# Patient Record
Sex: Female | Born: 1990 | Race: White | Hispanic: No | Marital: Single | Smoking: Former smoker
Health system: Southern US, Community
[De-identification: ages and names within clinical notes are randomized; demographics above are authoritative.]

## PROBLEM LIST (undated history)

## (undated) HISTORY — PX: INDUCED ABORTION: SHX677

---

## 2014-06-02 ENCOUNTER — Encounter (HOSPITAL_COMMUNITY): Payer: Self-pay | Admitting: Emergency Medicine

## 2014-06-02 ENCOUNTER — Emergency Department (HOSPITAL_COMMUNITY)
Admission: EM | Admit: 2014-06-02 | Discharge: 2014-06-02 | Disposition: A | Discharge status: Home / Self Care | Payer: Medicaid Other | Source: Home / Self Care | Attending: Emergency Medicine | Admitting: Emergency Medicine

## 2014-06-02 DIAGNOSIS — Z309 Encounter for contraceptive management, unspecified: Secondary | ICD-10-CM

## 2014-06-02 DIAGNOSIS — Z789 Other specified health status: Secondary | ICD-10-CM

## 2014-06-02 MED ORDER — LEVONORGESTREL 0.75 MG PO TABS: 0.7500 mg | ORAL_TABLET | Freq: Two times a day (BID) | ORAL | Status: DC

## 2014-06-02 NOTE — ED Provider Notes (Signed)
  Chief Complaint   Chief Complaint  Patient presents with  . Medication Refill    History of Present Illness   Taylor Vang is a 23 year old female who had unprotected intercourse yesterday morning. She comes in today to get the Plan B. She cannot buy this over-the-counter because of her Medicaid insurance which will not cover it unless she has a physician's prescription. She denies any GYN complaints. She has had no history of high blood pressure, phlebitis, blood clots, kidney or liver disease. Her menses have been regular. She is pregnant or breast-feeding.  Review of Systems   Other than as noted above, the patient denies any of the following symptoms: Systemic:  No fever or chills GI:  No abdominal pain, nausea, vomiting, diarrhea, constipation, melena or hematochezia. GU:  No dysuria, frequency, urgency, hematuria, vaginal discharge, itching, or abnormal vaginal bleeding.  PMFSH   Past medical history, family history, social history, meds, and allergies were reviewed.    Physical Examination    Vital signs:  There were no vitals taken for this visit. General:  Alert, oriented and in no distress. Lungs:  Breath sounds clear and equal bilaterally.  No wheezes, rales or rhonchi. Heart:  Regular rhythm.  No gallops or murmers. Abdomen:  Soft, flat and non-distended.  No organomegaly or mass.  No tenderness, guarding or rebound.  Bowel sounds normally active. Skin:  Clear, warm and dry.  Assessment   The encounter diagnosis was Emergency contraception.       Plan    1.  Meds:  The following meds were prescribed:   Discharge Medication List as of 06/02/2014  5:04 PM    START taking these medications   Details  levonorgestrel (PLAN B) 0.75 MG tablet Take 1 tablet (0.75 mg total) by mouth every 12 (twelve) hours., Starting 06/02/2014, Until Discontinued, Normal        2.  Patient Education/Counseling:  The patient was given appropriate handouts, self care  instructions, and instructed in symptomatic relief.    3.  Follow up:  The patient was told to follow up here if no better in 3 to 4 days, or sooner if becoming worse in any way, and given some red flag symptoms such as worsening pain, fever, persistent vomiting, or heavy vaginal bleeding which would prompt immediate return.       Reuben Likesavid C Aamilah Augenstein, MD 06/02/14 [phone]

## 2014-06-02 NOTE — Discharge Instructions (Signed)
Emergency Contraception  Emergency contraceptives prevent pregnancy after unprotected sexual intercourse. They can also be used:  · When a condom breaks.  · After a sexual assault.  · If you forgot to take your birth control pills.  · When inadequate protection occurs with sexual intercourse.  Usually, emergency contraception is a pill or combination of pills taken right after sex or up to 5 days after unprotected sex. It is most effective the sooner you take the pills after having sexual intercourse. Most types of emergency contraceptive pills are available without a prescription. One type requires a prescription from your health care provider. Also, young women under age 17 need a prescription for most types of emergency contraception. Check with your pharmacist. Do not use emergency contraception as your only form of birth control. These pills do not protect against sexually transmitted infections (STIs).   Emergency contraception will not work if you are already pregnant and will not harm the baby if you are pregnant. Emergency contraception does not cause an abortion. The pills work by preventing the ovaries from releasing an egg (ovulation) or the fertilization of an egg. Taking St. John's wort, certain antibiotic medicines, and certain anticonvulsant medicines may make these pills less effective.  Discuss with your health care provider the possible side effects of emergency contraceptives. These may include:  · Abdominal pain and cramps.  · Breast tenderness.  · Headache.  · Dizziness.  · Fatigue.  · Irregular bleeding or spotting.  TYPES OF EMERGENCY CONTRACEPTIVES  · Some types of emergency contraceptive pills contain estrogen and progesterone in higher doses.  · Some types just contain progesterone. They are available as a single pill or two pills taken 12-24 hours apart.  · One type of pill is not a hormone. It prevents the hormone progesterone from having its normal effect on ovulation and the lining of  the uterus.  · An intrauterine device (IUD) may be used. This T-shaped device is also used as a form of birth control. It is inserted into the uterus to prevent pregnancy. The copper IUD can also be used as emergency contraception if inserted within 5 days of having unprotected intercourse.  HOME CARE INSTRUCTIONS   · Eat something before taking the emergency contraceptive pills.  · Lie down for a couple of hours if you become tired or dizzy.  · Continue using birth control until you start your menstrual period.  SEEK MEDICAL CARE IF:   · You throw up (vomit) within 2 hours after taking the pill. You will have to take another pill.  · You need treatment for nausea, vomiting, headache, or abdominal cramps.  · You have not had a menstrual period 21 days after taking the pill.  · You are having irregular bleeding or spotting.  SEEK IMMEDIATE MEDICAL CARE IF:   · You have chest pain.  · You have leg pain.  · You have numbness or weakness of your arms or legs.  · You have slurred speech.  · You have visual problems.  Document Released: 12/09/2001 Document Revised: 02/14/2014 Document Reviewed: 03/14/2013  ExitCare® Patient Information ©2015 [employer]. This information is not intended to replace advice given to you by your health care provider. Make sure you discuss any questions you have with your health care provider.

## 2014-06-02 NOTE — ED Notes (Signed)
Pt requesting morning after pill.

## 2014-11-05 ENCOUNTER — Inpatient Hospital Stay (HOSPITAL_COMMUNITY)
Admission: EM | Admit: 2014-11-05 | Discharge: 2014-11-06 | Disposition: A | Discharge status: Home / Self Care | Payer: Medicaid Other | Attending: Obstetrics and Gynecology | Admitting: Obstetrics and Gynecology

## 2014-11-05 DIAGNOSIS — O4690 Antepartum hemorrhage, unspecified, unspecified trimester: Secondary | ICD-10-CM

## 2014-11-05 DIAGNOSIS — O034 Incomplete spontaneous abortion without complication: Secondary | ICD-10-CM | POA: Diagnosis present

## 2014-11-05 DIAGNOSIS — Z87891 Personal history of nicotine dependence: Secondary | ICD-10-CM | POA: Insufficient documentation

## 2014-11-05 DIAGNOSIS — R55 Syncope and collapse: Secondary | ICD-10-CM | POA: Diagnosis not present

## 2014-11-05 LAB — BASIC METABOLIC PANEL
Anion gap: 8 (ref 5–15)
BUN: 10 mg/dL (ref 6–23)
CHLORIDE: 106 mmol/L (ref 96–112)
CO2: 21 mmol/L (ref 19–32)
Calcium: 9.3 mg/dL (ref 8.4–10.5)
Creatinine, Ser: 0.49 mg/dL — ABNORMAL LOW (ref 0.50–1.10)
GFR calc non Af Amer: >90 mL/min (ref >90)
Glucose, Bld: 105 mg/dL — ABNORMAL HIGH (ref 70–99)
Potassium: 3.5 mmol/L (ref 3.5–5.1)
Sodium: 135 mmol/L (ref 135–145)

## 2014-11-05 LAB — CBC WITH DIFFERENTIAL/PLATELET
Basophils Absolute: 0 10*3/uL (ref 0.0–0.1)
Basophils Relative: 0 % (ref 0–1)
Eosinophils Absolute: 0.2 10*3/uL (ref 0.0–0.7)
Eosinophils Relative: 2 % (ref 0–5)
HEMATOCRIT: 34.5 % — AB (ref 36.0–46.0)
HEMOGLOBIN: 11.7 g/dL — AB (ref 12.0–15.0)
LYMPHS PCT: 22 % (ref 12–46)
Lymphs Abs: 2.2 10*3/uL (ref 0.7–4.0)
MCH: 30.2 pg (ref 26.0–34.0)
MCHC: 33.9 g/dL (ref 30.0–36.0)
MCV: 89.1 fL (ref 78.0–100.0)
MONO ABS: 0.7 10*3/uL (ref 0.1–1.0)
Monocytes Relative: 7 % (ref 3–12)
Neutro Abs: 6.8 10*3/uL (ref 1.7–7.7)
Neutrophils Relative %: 69 % (ref 43–77)
PLATELETS: 166 10*3/uL (ref 150–400)
RBC: 3.87 MIL/uL (ref 3.87–5.11)
RDW: 12.4 % (ref 11.5–15.5)
WBC: 9.9 10*3/uL (ref 4.0–10.5)

## 2014-11-05 LAB — I-STAT CHEM 8, ED
BUN: 8 mg/dL (ref 6–23)
CHLORIDE: 105 mmol/L (ref 96–112)
CREATININE: 0.5 mg/dL (ref 0.50–1.10)
Calcium, Ion: 1.19 mmol/L (ref 1.12–1.23)
Glucose, Bld: 109 mg/dL — ABNORMAL HIGH (ref 70–99)
HCT: 37 % (ref 36.0–46.0)
HEMOGLOBIN: 12.6 g/dL (ref 12.0–15.0)
Potassium: 3.5 mmol/L (ref 3.5–5.1)
SODIUM: 139 mmol/L (ref 135–145)
TCO2: 18 mmol/L (ref 0–100)

## 2014-11-05 MED ORDER — SODIUM CHLORIDE 0.9 % IV BOLUS (SEPSIS)
1000.0000 mL | Freq: Once | INTRAVENOUS | Status: AC
  Administered 2014-11-05: 1000 mL via INTRAVENOUS

## 2014-11-05 MED ORDER — SODIUM CHLORIDE 0.9 % IJ SOLN
3.0000 mL | Freq: Two times a day (BID) | INTRAMUSCULAR | Status: DC
  Administered 2014-11-05: 3 mL via INTRAVENOUS

## 2014-11-05 MED ORDER — SODIUM CHLORIDE 0.9 % IV SOLN
250.0000 mL | INTRAVENOUS | Status: DC | PRN
  Administered 2014-11-06: 250 mL via INTRAVENOUS

## 2014-11-05 MED ORDER — SODIUM CHLORIDE 0.9 % IJ SOLN: 3.0000 mL | INTRAMUSCULAR | Status: DC | PRN

## 2014-11-05 NOTE — ED Notes (Signed)
MD and PA notified patient is having gross amount of bright red vaginal bleeding and is starting to feel dizziness. Still ambulatory with steady gait.

## 2014-11-05 NOTE — ED Provider Notes (Addendum)
CSN: [id number]     Arrival date & time 11/05/14  2206 History   First MD Initiated Contact with Patient 11/05/14 2224     Chief Complaint  Patient presents with  . Vaginal Bleeding     (Consider location/radiation/quality/duration/timing/severity/associated sxs/prior Treatment) HPI 24 year old female, G1, P0, presents to emergency department with complaint of heavy vaginal bleeding.  She reports she has had spotting for the last 3 days, tonight had "profuse vaginal bleeding" with abdominal cramping.  Her last normal menstrual period was in November.  She reports only a few days of spotting in December.  Patient is not currently taking birth control.  She reports she has potential to be pregnant.  She denies any nausea, breast tenderness or other early pregnancy signs.  Patient has a 25-year-old daughter, delivered by C-section secondary to failure to progress after induction.  She denies any other surgeries, medical problems or medications.  Patient reports that she is slightly dizzy, but feeling better now that she is laying down.  Patient was tachycardic upon arrival. No past medical history on file. No past surgical history on file. No family history on file. History  Substance Use Topics  . Smoking status: Never Smoker   . Smokeless tobacco: Not on file  . Alcohol Use: Yes   OB History    No data available     Review of Systems  See History of Present Illness; otherwise all other systems are reviewed and negative   Allergies  Sulfa antibiotics  Home Medications   Prior to Admission medications   Medication Sig Start Date End Date Taking? Authorizing Provider  levonorgestrel (PLAN B) 0.75 MG tablet Take 1 tablet (0.75 mg total) by mouth every 12 (twelve) hours. 06/02/14   Reuben Likesavid C Keller, MD   BP 115/67 mmHg  Pulse 75  Temp(Src) 98.4 F (36.9 C) (Oral)  Resp 16  SpO2 100%  LMP 10/05/2014 (Approximate) Physical Exam  Constitutional: She is oriented to person, place, and  time. She appears well-developed and well-nourished.  HENT:  Head: Normocephalic and atraumatic.  Nose: Nose normal.  Mouth/Throat: Oropharynx is clear and moist.  Eyes: Conjunctivae and EOM are normal. Pupils are equal, round, and reactive to light.  Neck: Normal range of motion. Neck supple. No JVD present. No tracheal deviation present. No thyromegaly present.  Cardiovascular: Normal rate, regular rhythm, normal heart sounds and intact distal pulses.  Exam reveals no gallop and no friction rub.   No murmur heard. Pulmonary/Chest: Effort normal and breath sounds normal. No stridor. No respiratory distress. She has no wheezes. She has no rales. She exhibits no tenderness.  Abdominal: Soft. Bowel sounds are normal. She exhibits no distension and no mass. There is no tenderness. There is no rebound and no guarding.  Genitourinary:  External genitalia within normal limits Vagina with large amount of blood discharge Cervix  os is slightly open, large blood clot noted.  No lacerations or other source of bleeding noted negative for cervical motion tenderness Adnexa palpated, no masses or negative for tenderness noted Bladder palpated negative for tenderness Uterus palpated no masses and negative for tenderness    Musculoskeletal: Normal range of motion. She exhibits no edema or tenderness.  Lymphadenopathy:    She has no cervical adenopathy.  Neurological: She is alert and oriented to person, place, and time. She displays normal reflexes. She exhibits normal muscle tone. Coordination normal.  Skin: Skin is warm and dry. No rash noted. No erythema. No pallor.  Psychiatric: She has a  normal mood and affect. Her behavior is normal. Judgment and thought content normal.  Nursing note and vitals reviewed.   ED Course  Procedures (including critical care time) Labs Review Labs Reviewed  HCG, QUANTITATIVE, PREGNANCY - Abnormal; Notable for the following:    hCG, Beta Chain, [place] [address] (*)     All other components within normal limits  BASIC METABOLIC PANEL - Abnormal; Notable for the following:    Glucose, Bld 105 (*)    Creatinine, Ser 0.49 (*)    All other components within normal limits  CBC WITH DIFFERENTIAL/PLATELET - Abnormal; Notable for the following:    Hemoglobin 11.7 (*)    HCT 34.5 (*)    All other components within normal limits  CBC - Abnormal; Notable for the following:    WBC 14.8 (*)    RBC 3.09 (*)    Hemoglobin 9.4 (*)    HCT 27.7 (*)    All other components within normal limits  I-STAT CHEM 8, ED - Abnormal; Notable for the following:    Glucose, Bld 109 (*)    All other components within normal limits  TYPE AND SCREEN  ABO/RH    Imaging Review No results found.   EKG Interpretation None     CRITICAL CARE Performed by: Olivia Mackie Total critical care time: 90 min Critical care time was exclusive of separately billable procedures and treating other patients. Critical care was necessary to treat or prevent imminent or life-threatening deterioration. Critical care was time spent personally by me on the following activities: development of treatment plan with patient and/or surrogate as well as nursing, discussions with consultants, evaluation of patient's response to treatment, examination of patient, obtaining history from patient or surrogate, ordering and performing treatments and interventions, ordering and review of laboratory studies, ordering and review of radiographic studies, pulse oximetry and re-evaluation of patient's condition.  MDM   Final diagnoses:  Vaginal bleeding in pregnancy  Incomplete miscarriage    24 year old female with significant vaginal bleeding requiring suctioning and multiple qtips to clear vagina.  She has a clot in the os and bleeding around the clot.  Patient is nontender.  Differential at this time includes miscarriage, ectopic pregnancy, menorrhagia.  Plan for labs, fluid bolus, pregnancy test and  ultrasound.  12:47 AM HCG is positive, ultrasound ordered.  Patient hemodynamically stable, still having bleeding.  Patient updated on findings and plan.  2:19 AM No signs of ectopic pregnancy, IUP with slight sac, abortion in progress.  Patient updated on findings.  She reports that she is still passing clots and having gushes of blood, but it has slowed down significantly.  She tends to have periods of bleeding and then it will cease for some time.  She is having some lower abdominal cramping.  She reports that she has been followed with [facility] in the past, but is unsure if she is currently a patient with them.  [facility], if she is bleeding more than a pad an hour continuously, weakness, dizziness, worsening pain or other new concerning symptoms.  Olivia Mackie, MD 11/06/14 0224  3:40 AM Pt was to be d/c, but after iv was removed began to have n/v.  ODT zofran given, pt still vomiting, weak dizzy, low BP.  Plan for recheck of cbc, replace iv and give iv zofran, fluid bolus.  4:15 AM Case discussed with Dr. Emelda Fear, on-call for OB/GYN.  He requests Pitocin infusion started.  Patient improved after almost a liter of fluid, blood pressure has come up to 90s over 50s.  She is no longer feeling dizzy or lightheaded while laying flat.  Hemoglobin rechecked, 2 point drop.  Patient reports bleeding has stopped at this time.  Patient to be transferred over to [facility], [facility] for further evaluation and stabilization.  Olivia Mackie, MD 11/06/14 [phone]

## 2014-11-05 NOTE — ED Notes (Addendum)
Pt reports "perfuse vaginal bleeding" with "big red clots" starting recently with abdominal cramping. Afraid "I am losing too much blood." Last menstrual cycle was regular and a month ago. Patient is unsure if she could be pregnant. Patient is tearful and appears anxious. No other complaints/concerns.

## 2014-11-06 ENCOUNTER — Encounter (HOSPITAL_COMMUNITY): Payer: Self-pay | Admitting: *Deleted

## 2014-11-06 ENCOUNTER — Emergency Department (HOSPITAL_COMMUNITY): Payer: Medicaid Other

## 2014-11-06 DIAGNOSIS — O034 Incomplete spontaneous abortion without complication: Secondary | ICD-10-CM | POA: Diagnosis present

## 2014-11-06 LAB — CBC
HCT: 27.7 % — ABNORMAL LOW (ref 36.0–46.0)
Hemoglobin: 9.4 g/dL — ABNORMAL LOW (ref 12.0–15.0)
MCH: 30.4 pg (ref 26.0–34.0)
MCHC: 33.9 g/dL (ref 30.0–36.0)
MCV: 89.6 fL (ref 78.0–100.0)
Platelets: 193 10*3/uL (ref 150–400)
RBC: 3.09 MIL/uL — ABNORMAL LOW (ref 3.87–5.11)
RDW: 12.5 % (ref 11.5–15.5)
WBC: 14.8 10*3/uL — ABNORMAL HIGH (ref 4.0–10.5)

## 2014-11-06 LAB — TYPE AND SCREEN
ABO/RH(D): B POS
ANTIBODY SCREEN: NEGATIVE

## 2014-11-06 LAB — HCG, QUANTITATIVE, PREGNANCY: hCG, Beta Chain, Quant, S: 20196 m[IU]/mL — ABNORMAL HIGH (ref <5)

## 2014-11-06 LAB — ABO/RH: ABO/RH(D): B POS

## 2014-11-06 MED ORDER — OXYTOCIN 40 UNITS IN LACTATED RINGERS INFUSION - SIMPLE MED
250.0000 mL/h | INTRAVENOUS | Status: DC
  Administered 2014-11-06: 250 mL/h via INTRAVENOUS
  Filled 2014-11-06: qty 1000

## 2014-11-06 MED ORDER — ONDANSETRON 8 MG PO TBDP
8.0000 mg | ORAL_TABLET | Freq: Once | ORAL | Status: AC
  Administered 2014-11-06: 8 mg via ORAL
  Filled 2014-11-06: qty 2

## 2014-11-06 MED ORDER — NORETHINDRONE 0.35 MG PO TABS: 1.0000 | ORAL_TABLET | Freq: Every day | ORAL | Status: AC

## 2014-11-06 MED ORDER — ONDANSETRON 8 MG PO TBDP: 8.0000 mg | ORAL_TABLET | Freq: Three times a day (TID) | ORAL | Status: AC | PRN

## 2014-11-06 MED ORDER — IBUPROFEN 800 MG PO TABS
800.0000 mg | ORAL_TABLET | Freq: Once | ORAL | Status: AC
  Administered 2014-11-06: 800 mg via ORAL
  Filled 2014-11-06: qty 1

## 2014-11-06 MED ORDER — SODIUM CHLORIDE 0.9 % IV BOLUS (SEPSIS)
1000.0000 mL | Freq: Once | INTRAVENOUS | Status: AC
  Administered 2014-11-06: 1000 mL via INTRAVENOUS

## 2014-11-06 MED ORDER — IBUPROFEN 800 MG PO TABS: 800.0000 mg | ORAL_TABLET | Freq: Three times a day (TID) | ORAL | Status: AC | PRN

## 2014-11-06 MED ORDER — FENTANYL CITRATE 0.05 MG/ML IJ SOLN
50.0000 ug | Freq: Once | INTRAMUSCULAR | Status: AC
Start: 2014-11-06 — End: 2014-11-06
  Administered 2014-11-06: 50 ug via INTRAVENOUS
  Filled 2014-11-06: qty 2

## 2014-11-06 MED ORDER — ONDANSETRON HCL 4 MG/2ML IJ SOLN
4.0000 mg | Freq: Once | INTRAMUSCULAR | Status: AC
  Administered 2014-11-06: 4 mg via INTRAVENOUS
  Filled 2014-11-06: qty 2

## 2014-11-06 MED ORDER — MORPHINE SULFATE 4 MG/ML IJ SOLN
4.0000 mg | Freq: Once | INTRAMUSCULAR | Status: AC
  Administered 2014-11-06: 4 mg via INTRAVENOUS
  Filled 2014-11-06: qty 1

## 2014-11-06 MED ORDER — MISOPROSTOL 200 MCG PO TABS: 200.0000 ug | ORAL_TABLET | Freq: Once | ORAL | Status: AC

## 2014-11-06 MED ORDER — HYDROCODONE-ACETAMINOPHEN 5-325 MG PO TABS
2.0000 | ORAL_TABLET | ORAL | Status: AC | PRN
Start: 2014-11-06 — End: ?

## 2014-11-06 MED ORDER — IBUPROFEN 800 MG PO TABS
800.0000 mg | ORAL_TABLET | Freq: Once | ORAL | Status: DC
  Filled 2014-11-06: qty 1

## 2014-11-06 NOTE — Progress Notes (Signed)
Dr Emelda FearFerguson notified of pt's admission and status. Will see pt

## 2014-11-06 NOTE — Progress Notes (Signed)
Written and verbal d/c instructions given and understanding voiced. 

## 2014-11-06 NOTE — MAU Provider Note (Signed)
History     CSN: [id number]  Arrival date and time: 11/05/14 2206   None     Chief Complaint  Patient presents with  . Vaginal Bleeding    pregnancy diagnosed tonight   HPI  Transferred from [facility] where pt was evaluated , miscarrage diagnosed and heavy bleeding occurred, and pt became syncopal due to bleeding, received iv fluids and pitocin, transferred here for completion of eval.   Pertinent Gynecological History: Menses: late on menses, no known dx of pregnancy til tonight Bleeding: intermenstrual bleeding Contraception: none DES exposure: unknown Blood transfusions: none Sexually transmitted diseases: no past history and new partner x 1 yr. Previous GYN Procedures:   Last mammogram:  Date:  Last pap:  Date:  P t has noted a tiny 1 mm pigmented area at clitoris, that is smooth edged not hard, nontender. It is either a skin nevus or early condyloma. Differential and monitoring recommendations discussed at length. Excision to be considered for skin changes , growth.  History reviewed. No pertinent past medical history.  Past Surgical History  Procedure Laterality Date  . Cesarean section    . Induced abortion      Family History  Problem Relation Age of Onset  . Alcohol abuse Neg Hx     History  Substance Use Topics  . Smoking status: Former Smoker    Types: Cigarettes  . Smokeless tobacco: Not on file  . Alcohol Use: 1.2 oz/week    2 Cans of beer per week    Allergies:  Allergies  Allergen Reactions  . Sulfa Antibiotics Shortness Of Breath and Rash    Prescriptions prior to admission  Medication Sig Dispense Refill Last Dose  . Multiple Vitamin (MULTIVITAMIN WITH MINERALS) TABS tablet Take 1-3 tablets by mouth daily.   11/05/2014 at Unknown time  . levonorgestrel (PLAN B) 0.75 MG tablet Take 1 tablet (0.75 mg total) by mouth every 12 (twelve) hours. (Patient not taking: Reported on 11/05/2014) 2 tablet 5     ROS Physical Exam   Blood pressure 98/55,  pulse 104, temperature 98 F (36.7 C), temperature source Oral, resp. rate 18, last menstrual period 10/05/2014, SpO2 100 %, unknown if currently breastfeeding.  Physical Exam  Constitutional: She is oriented to person, place, and time. She appears well-developed and well-nourished.  HENT:  Head: Normocephalic and atraumatic.  Eyes: Pupils are equal, round, and reactive to light.  Pale, Eurasian lite skin  Neck: Normal range of motion.  Cardiovascular: Normal rate and regular rhythm.   Respiratory: Effort normal.  GI: Soft.  Genitourinary: Vagina normal.  Uterus shows an open os 1.5 cm, allowing passage of Ring forceps, Tissue and clot visible in os. Able to extract several fragments of tissue by serial extraction by ring forceps, Ultrasound at bedside shows a small thin remaining endometrial tissue less than 1 cm thick, pt expected to easily expel any residual tissue fragments , and further uterine exploration declined.  Musculoskeletal: Normal range of motion.  Neurological: She is alert and oriented to person, place, and time.  Skin: Skin is warm and dry.  Psychiatric: She has a normal mood and affect. Her behavior is normal. Thought content normal.   CBC    Component Value Date/Time   WBC 14.8* 11/06/2014 0355   RBC 3.09* 11/06/2014 0355   HGB 9.4* 11/06/2014 0355   HCT 27.7* 11/06/2014 0355   PLT 193 11/06/2014 0355   MCV 89.6 11/06/2014 0355   MCH 30.4 11/06/2014 0355   MCHC  33.9 11/06/2014 0355   RDW 12.5 11/06/2014 0355   LYMPHSABS 2.2 11/05/2014 2312   MONOABS 0.7 11/05/2014 2312   EOSABS 0.2 11/05/2014 2312   BASOSABS 0.0 11/05/2014 2312     MAU Course  Procedures extraction of tissue from os, and PO cytotec administered. Blood type confirmed as B POS. MDM   Assessment and Plan  Incomplete ab, completed. Rh POSitive  Plan PO cytotec        D/C home      followup at gyn 2 wk     Rx micronor   Jenya Putz V 11/06/2014, 6:36 AM

## 2014-11-06 NOTE — MAU Note (Signed)
[facility]. Pt alert and oriented. Pale color. On arrival voided 400cc on bedpan and passed 5cm clot.

## 2014-11-06 NOTE — Discharge Instructions (Signed)
Pelvic Rest Pelvic rest is sometimes recommended for women when:   The placenta is partially or completely covering the opening of the cervix (placenta previa).  There is bleeding between the uterine wall and the amniotic sac in the first trimester (subchorionic hemorrhage).  The cervix begins to open without labor starting (incompetent cervix, cervical insufficiency).  The labor is too early (preterm labor). HOME CARE INSTRUCTIONS  Do not have sexual intercourse, stimulation, or an orgasm.  Do not use tampons, douche, or put anything in the vagina.  Do not lift anything over 10 pounds (4.5 kg).  Avoid strenuous activity or straining your pelvic muscles. SEEK MEDICAL CARE IF:  You have any vaginal bleeding during pregnancy. Treat this as a potential emergency.  You have cramping pain felt low in the stomach (stronger than menstrual cramps).  You notice vaginal discharge (watery, mucus, or bloody).  You have a low, dull backache.  There are regular contractions or uterine tightening. SEEK IMMEDIATE MEDICAL CARE IF: You have vaginal bleeding and have placenta previa.  Document Released: 01/25/2011 Document Revised: 12/23/2011 Document Reviewed: 01/25/2011 [facility] Patient Information 2015 [place] [place]. This information is not intended to replace advice given to you by your health care provider. Make sure you discuss any questions you have with your health care provider.  Miscarriage A miscarriage is the sudden loss of an unborn baby (fetus) before the 20th week of pregnancy. Most miscarriages happen in the first 3 months of pregnancy. Sometimes, it happens before a woman even knows she is pregnant. A miscarriage is also called a "spontaneous miscarriage" or "early pregnancy loss." Having a miscarriage can be an emotional experience. Talk with your caregiver about any questions you may have about miscarrying, the grieving process, and your future pregnancy plans. CAUSES    Problems with the fetal chromosomes that make it impossible for the baby to develop normally. Problems with the baby's genes or chromosomes are most often the result of errors that occur, by chance, as the embryo divides and grows. The problems are not inherited from the parents.  Infection of the cervix or uterus.   Hormone problems.   Problems with the cervix, such as having an incompetent cervix. This is when the tissue in the cervix is not strong enough to hold the pregnancy.   Problems with the uterus, such as an abnormally shaped uterus, uterine fibroids, or congenital abnormalities.   Certain medical conditions.   Smoking, drinking alcohol, or taking illegal drugs.   Trauma.  Often, the cause of a miscarriage is unknown.  SYMPTOMS   Vaginal bleeding or spotting, with or without cramps or pain.  Pain or cramping in the abdomen or lower back.  Passing fluid, tissue, or blood clots from the vagina. DIAGNOSIS  Your caregiver will perform a physical exam. You may also have an ultrasound to confirm the miscarriage. Blood or urine tests may also be ordered. TREATMENT   Sometimes, treatment is not necessary if you naturally pass all the fetal tissue that was in the uterus. If some of the fetus or placenta remains in the body (incomplete miscarriage), tissue left behind may become infected and must be removed. Usually, a dilation and curettage (D and C) procedure is performed. During a D and C procedure, the cervix is widened (dilated) and any remaining fetal or placental tissue is gently removed from the uterus.  Antibiotic medicines are prescribed if there is an infection. Other medicines may be given to reduce the size of the uterus (contract) if  there is a lot of bleeding.  If you have Rh negative blood and your baby was Rh positive, you will need a Rh immunoglobulin shot. This shot will protect any future baby from having Rh blood problems in future pregnancies. HOME  CARE INSTRUCTIONS   Your caregiver may order bed rest or may allow you to continue light activity. Resume activity as directed by your caregiver.  Have someone help with home and family responsibilities during this time.   Keep track of the number of sanitary pads you use each day and how soaked (saturated) they are. Write down this information.   Do not use tampons. Do not douche or have sexual intercourse until approved by your caregiver.   Only take over-the-counter or prescription medicines for pain or discomfort as directed by your caregiver.   Do not take aspirin. Aspirin can cause bleeding.   Keep all follow-up appointments with your caregiver.   If you or your partner have problems with grieving, talk to your caregiver or seek counseling to help cope with the pregnancy loss. Allow enough time to grieve before trying to get pregnant again.  SEEK IMMEDIATE MEDICAL CARE IF:   You have severe cramps or pain in your back or abdomen.  You have a fever.  You pass large blood clots (walnut-sized or larger) ortissue from your vagina. Save any tissue for your caregiver to inspect.   Your bleeding increases.   You have a thick, bad-smelling vaginal discharge.  You become lightheaded, weak, or you faint.   You have chills.  MAKE SURE YOU:  Understand these instructions.  Will watch your condition.  Will get help right away if you are not doing well or get worse. Document Released: 03/26/2001 Document Revised: 01/25/2013 Document Reviewed: 11/19/2011 [facility] Patient Information 2015 [place], [place]. This information is not intended to replace advice given to you by your health care provider. Make sure you discuss any questions you have with your health care provider.

## 2014-11-06 NOTE — MAU Note (Signed)
Pt stood at bedside and tolerated well. No dizziness. Drinking flds well.

## 2014-11-06 NOTE — Progress Notes (Signed)
Dr Emelda FearFerguson removed some tissue from cervix and sent to pathology

## 2014-11-06 NOTE — ED Notes (Signed)
Pt discharge was rescinded d/t patient weakness, N/V episodes, and gross amount of hemorrhage. Pt states she continued to feel weak every time she stands to put on her clothes to go home. She is accompanied by her significant other at the bedside. MD aware that pt is feeling weak and hypotensive. Syncope episode while patient in sitting position was witnessed by Clinical research associatewriter, Alvester ChouJeneen,RN and  Physician and pt was assisted to supine position.

## 2014-11-06 NOTE — Progress Notes (Addendum)
Dr Emelda FearFerguson did bedside u/s and only small tissue fragments seen in  Uterus. OK to d/c IVFs per Dr Emelda FearFerguson

## 2014-12-10 ENCOUNTER — Emergency Department (HOSPITAL_COMMUNITY)
Admission: EM | Admit: 2014-12-10 | Discharge: 2014-12-10 | Disposition: A | Discharge status: Home / Self Care | Payer: Medicaid Other | Source: Home / Self Care | Attending: Emergency Medicine | Admitting: Emergency Medicine

## 2014-12-10 ENCOUNTER — Encounter (HOSPITAL_COMMUNITY): Payer: Self-pay | Admitting: Emergency Medicine

## 2014-12-10 DIAGNOSIS — B001 Herpesviral vesicular dermatitis: Secondary | ICD-10-CM

## 2014-12-10 NOTE — ED Notes (Addendum)
Cold sores, "nervous system failure" per patient Called out of work today, employer demanding a note

## 2014-12-10 NOTE — Discharge Instructions (Signed)
Cold Sore  A cold sore (fever blister) is a skin infection caused by the herpes simplex virus (HSV-1). HSV-1 is closely related to the virus that causes genital herpes (HSV-2), but they are not the same even though both viruses can cause oral and genital infections. Cold sores are small, fluid-filled sores inside of the mouth or on the lips, gums, nose, chin, cheeks, or fingers.   The herpes simplex virus can be easily passed (contagious) to other people through close personal contact, such as kissing or sharing personal items. The virus can also spread to other parts of the body, such as the eyes or genitals. Cold sores are contagious until the sores crust over completely. They often heal within 2 weeks.   Once a person is infected, the herpes simplex virus remains permanently in the body. Therefore, there is no cure for cold sores, and they often recur when a person is tired, stressed, sick, or gets too much sun. Additional factors that can cause a recurrence include hormone changes in menstruation or pregnancy, certain drugs, and cold weather.   CAUSES   Cold sores are caused by the herpes simplex virus. The virus is spread from person to person through close contact, such as through kissing, touching the affected area, or sharing personal items such as lip balm, razors, or eating utensils.   SYMPTOMS   The first infection may not cause symptoms. If symptoms develop, the symptoms often go through different stages. Here is how a cold sore develops:   · Tingling, itching, or burning is felt 1-2 days before the outbreak.    · Fluid-filled blisters appear on the lips, inside the mouth, nose, or on the cheeks.    · The blisters start to ooze clear fluid.    · The blisters dry up and a yellow crust appears in its place.    · The crust falls off.    Symptoms depend on whether it is the initial outbreak or a recurrence. Some other symptoms with the first outbreak may include:   · Fever.    · Sore throat.    · Headache.     · Muscle aches.    · Swollen neck glands.    DIAGNOSIS   A diagnosis is often made based on your symptoms and looking at the sores. Sometimes, a sore may be swabbed and then examined in the lab to make a final diagnosis. If the sores are not present, blood tests can find the herpes simplex virus.   TREATMENT   There is no cure for cold sores and no vaccine for the herpes simplex virus. Within 2 weeks, most cold sores go away on their own without treatment. Medicines cannot make the infection go away, but medicine can help relieve some of the pain associated with the sores, can work to stop the virus from multiplying, and can also shorten healing time. Medicine may be in the form of creams, gels, pills, or a shot.   HOME CARE INSTRUCTIONS   · Only take over-the-counter or prescription medicines for pain, discomfort, or fever as directed by your caregiver. Do not use aspirin.    · Use a cotton-tip swab to apply creams or gels to your sores.    · Do not touch the sores or pick the scabs. Wash your hands often. Do not touch your eyes without washing your hands first.    · Avoid kissing, oral sex, and sharing personal items until sores heal.    · Apply an ice pack on your sores for 10-15 minutes to ease any discomfort.    ·   Avoid hot, cold, or salty foods because they may hurt your mouth. Eat a soft, bland diet to avoid irritating the sores. Use a straw to drink if you have pain when drinking out of a glass.    · Keep sores clean and dry to prevent an infection of other tissues.    · Avoid the sun and limit stress if these things trigger outbreaks. If sun causes cold sores, apply sunscreen on the lips before being out in the sun.    SEEK MEDICAL CARE IF:   · You have a fever or persistent symptoms for more than 2-3 days.    · You have a fever and your symptoms suddenly get worse.    · You have pus, not clear fluid, coming from the sores.    · You have redness that is spreading.    · You have pain or irritation in your  eye.    · You get sores on your genitals.    · Your sores do not heal within 2 weeks.    · You have a weakened immune system.    · You have frequent recurrences of cold sores.    MAKE SURE YOU:   · Understand these instructions.  · Will watch your condition.  · Will get help right away if you are not doing well or get worse.  Document Released: 09/27/2000 Document Revised: 02/14/2014 Document Reviewed: 02/12/2012  ExitCare® Patient Information ©2015 [employer]. This information is not intended to replace advice given to you by your health care provider. Make sure you discuss any questions you have with your health care provider.

## 2014-12-10 NOTE — ED Provider Notes (Signed)
CSN: [id number]     Arrival date & time 12/10/14  1640 History   First MD Initiated Contact with Patient 12/10/14 1825     Chief Complaint  Patient presents with  . Mouth Lesions   (Consider location/radiation/quality/duration/timing/severity/associated sxs/prior Treatment) HPI Comments: Patient states she presents here for work note excusing her from work today. She states she needed a "mental health day" but when she called he employer to tell him/her she was not coming to work, she was informed that she would need a doctor's note before she would be allowed to return to work. Also wishes to mention that she developed a cold sore at her left upper lip 3 days ago, but does not wish to be treated for this.  PCP; none  Patient is a 24 y.o. female presenting with mouth sores. The history is provided by the patient.  Mouth Lesions   History reviewed. No pertinent past medical history. Past Surgical History  Procedure Laterality Date  . Cesarean section    . Induced abortion     Family History  Problem Relation Age of Onset  . Alcohol abuse Neg Hx    History  Substance Use Topics  . Smoking status: Former Smoker    Types: Cigarettes  . Smokeless tobacco: Not on file  . Alcohol Use: 1.2 oz/week    2 Cans of beer per week   OB History    Gravida Para Term Preterm AB TAB SAB Ectopic Multiple Living   Review of Systems  Constitutional: Negative.   HENT: Positive for mouth sores.   Eyes: Negative.   Respiratory: Negative.   Skin: Negative.        Cold sore at left upper lip  Psychiatric/Behavioral: Negative for suicidal ideas, hallucinations, behavioral problems, confusion, sleep disturbance, self-injury, dysphoric mood, decreased concentration and agitation. The patient is not nervous/anxious and is not hyperactive.     Allergies  Sulfa antibiotics  Home Medications   Prior to Admission medications   Medication Sig Start Date End Date Taking?  Authorizing Provider  Multiple Vitamin (MULTIVITAMIN WITH MINERALS) TABS tablet Take 1-3 tablets by mouth daily.   Yes Historical Provider, MD  HYDROcodone-acetaminophen (NORCO/VICODIN) 5-325 MG per tablet Take 2 tablets by mouth every 4 (four) hours as needed for moderate pain or severe pain. Patient not taking: Reported on 12/10/2014 11/06/14   Olivia Mackie, MD  ibuprofen (ADVIL,MOTRIN) 800 MG tablet Take 1 tablet (800 mg total) by mouth every 8 (eight) hours as needed for mild pain, moderate pain or cramping. 11/06/14   Olivia Mackie, MD  misoprostol (CYTOTEC) 200 MCG tablet Take 1 tablet (200 mcg total) by mouth once. Patient not taking: Reported on 12/10/2014 11/06/14   Tilda Burrow, MD  norethindrone (MICRONOR,CAMILA,ERRIN) 0.35 MG tablet Take 1 tablet (0.35 mg total) by mouth daily. Patient not taking: Reported on 12/10/2014 11/06/14   Tilda Burrow, MD  ondansetron (ZOFRAN ODT) 8 MG disintegrating tablet Take 1 tablet (8 mg total) by mouth every 8 (eight) hours as needed for nausea or vomiting. Patient not taking: Reported on 12/10/2014 11/06/14   Olivia Mackie, MD   BP 114/83 mmHg  Pulse 72  Temp(Src) 98.6 F (37 C) (Oral)  Resp 14  SpO2 100%  LMP 11/26/2014  Breastfeeding? No Physical Exam  Constitutional: She is oriented to person, place, and time. She appears well-developed and well-nourished. No distress.  HENT:  Head: Normocephalic and atraumatic.  Cardiovascular: Normal rate, regular rhythm and normal heart sounds.   Pulmonary/Chest: Effort normal and breath sounds normal.  Neurological: She is alert and oriented to person, place, and time.  Skin: Skin is warm and dry.  Psychiatric: She has a normal mood and affect. Her behavior is normal. Judgment and thought content normal.  Nursing note and vitals reviewed.   ED Course  Procedures (including critical care time) Labs Review Labs Reviewed - No data to display  Imaging Review No results found.   MDM   1. Herpes  simplex labialis       Ria ClockJennifer Lee H Tarisa Paola, [place] 12/10/14 2038

## 2015-02-02 ENCOUNTER — Encounter (HOSPITAL_COMMUNITY): Payer: Self-pay | Admitting: *Deleted

## 2015-02-02 ENCOUNTER — Emergency Department (HOSPITAL_COMMUNITY)
Admission: EM | Admit: 2015-02-02 | Discharge: 2015-02-02 | Disposition: A | Discharge status: Home / Self Care | Payer: Medicaid Other | Attending: Emergency Medicine | Admitting: Emergency Medicine

## 2015-02-02 DIAGNOSIS — Z3202 Encounter for pregnancy test, result negative: Secondary | ICD-10-CM | POA: Insufficient documentation

## 2015-02-02 DIAGNOSIS — Z87891 Personal history of nicotine dependence: Secondary | ICD-10-CM | POA: Insufficient documentation

## 2015-02-02 DIAGNOSIS — F16129 Hallucinogen abuse with intoxication, unspecified: Secondary | ICD-10-CM | POA: Diagnosis not present

## 2015-02-02 DIAGNOSIS — Z9889 Other specified postprocedural states: Secondary | ICD-10-CM | POA: Insufficient documentation

## 2015-02-02 DIAGNOSIS — Z79899 Other long term (current) drug therapy: Secondary | ICD-10-CM | POA: Diagnosis not present

## 2015-02-02 DIAGNOSIS — R4182 Altered mental status, unspecified: Secondary | ICD-10-CM | POA: Diagnosis present

## 2015-02-02 LAB — RAPID URINE DRUG SCREEN, HOSP PERFORMED
AMPHETAMINES: NOT DETECTED
BARBITURATES: NOT DETECTED
Benzodiazepines: NOT DETECTED
COCAINE: NOT DETECTED
OPIATES: NOT DETECTED
TETRAHYDROCANNABINOL: POSITIVE — AB

## 2015-02-02 LAB — CBC WITH DIFFERENTIAL/PLATELET
BASOS ABS: 0 10*3/uL (ref 0.0–0.1)
Basophils Relative: 0 % (ref 0–1)
Eosinophils Absolute: 0 10*3/uL (ref 0.0–0.7)
Eosinophils Relative: 0 % (ref 0–5)
HCT: 34.3 % — ABNORMAL LOW (ref 36.0–46.0)
Hemoglobin: 11 g/dL — ABNORMAL LOW (ref 12.0–15.0)
LYMPHS PCT: 10 % — AB (ref 12–46)
Lymphs Abs: 0.5 10*3/uL — ABNORMAL LOW (ref 0.7–4.0)
MCH: 27.3 pg (ref 26.0–34.0)
MCHC: 32.1 g/dL (ref 30.0–36.0)
MCV: 85.1 fL (ref 78.0–100.0)
MONO ABS: 0.1 10*3/uL (ref 0.1–1.0)
Monocytes Relative: 3 % (ref 3–12)
NEUTROS ABS: 4.7 10*3/uL (ref 1.7–7.7)
Neutrophils Relative %: 87 % — ABNORMAL HIGH (ref 43–77)
Platelets: 171 10*3/uL (ref 150–400)
RBC: 4.03 MIL/uL (ref 3.87–5.11)
RDW: 14.9 % (ref 11.5–15.5)
WBC: 5.4 10*3/uL (ref 4.0–10.5)

## 2015-02-02 LAB — ETHANOL: Alcohol, Ethyl (B): 15 mg/dL — ABNORMAL HIGH (ref 0–9)

## 2015-02-02 LAB — BASIC METABOLIC PANEL
Anion gap: 11 (ref 5–15)
BUN: 10 mg/dL (ref 6–23)
CALCIUM: 8.8 mg/dL (ref 8.4–10.5)
CO2: 20 mmol/L (ref 19–32)
Chloride: 104 mmol/L (ref 96–112)
Creatinine, Ser: 0.54 mg/dL (ref 0.50–1.10)
GFR calc Af Amer: >90 mL/min (ref >90)
GFR calc non Af Amer: >90 mL/min (ref >90)
Glucose, Bld: 109 mg/dL — ABNORMAL HIGH (ref 70–99)
Potassium: 3.3 mmol/L — ABNORMAL LOW (ref 3.5–5.1)
Sodium: 135 mmol/L (ref 135–145)

## 2015-02-02 LAB — ACETAMINOPHEN LEVEL: Acetaminophen (Tylenol), Serum: <10 ug/mL — ABNORMAL LOW (ref 10–30)

## 2015-02-02 LAB — PREGNANCY, URINE: PREG TEST UR: NEGATIVE

## 2015-02-02 LAB — SALICYLATE LEVEL

## 2015-02-02 MED ORDER — ZIPRASIDONE MESYLATE 20 MG IM SOLR
20.0000 mg | Freq: Once | INTRAMUSCULAR | Status: AC
  Administered 2015-02-02: 20 mg via INTRAMUSCULAR

## 2015-02-02 MED ORDER — STERILE WATER FOR INJECTION IJ SOLN
INTRAMUSCULAR | Status: AC
  Administered 2015-02-02: 1.2 mL
  Filled 2015-02-02: qty 10

## 2015-02-02 MED ORDER — ZIPRASIDONE MESYLATE 20 MG IM SOLR
INTRAMUSCULAR | Status: AC
  Administered 2015-02-02: 20 mg via INTRAMUSCULAR
  Filled 2015-02-02: qty 20

## 2015-02-02 NOTE — ED Notes (Signed)
Patient ambulatory to and from restroom without deficit 

## 2015-02-02 NOTE — ED Notes (Signed)
Patient grabbing at physician during his assessment. Verbal order for Geodon received.

## 2015-02-02 NOTE — ED Notes (Signed)
Meal (ham sandwich, apple sauce, and water) and pt belongings given to pt. Pt attempting to call for ride. Discharge instructions in process.

## 2015-02-02 NOTE — ED Notes (Signed)
Bed: WA04 Expected date:  Expected time:  Means of arrival:  Comments: EMS 23yo F, using acid, acting weird

## 2015-02-02 NOTE — ED Provider Notes (Signed)
CSN: [id number]     Arrival date & time 02/02/15  0220 History   First MD Initiated Contact with Patient 02/02/15 0253     Chief Complaint  Patient presents with  . Altered Mental Status     (Consider location/radiation/quality/duration/timing/severity/associated sxs/prior Treatment) HPI  Level 5 Caveat: altered mental status. This is a 24 year old female who was found wandering along the apartment complexes and into strangers' apartments this morning. She was retained by the police and EMS was summoned. EMS found her to have dilated pupils and she admitted to smoking marijuana and using "acid" and said she was "tripping". They report hallucinations and aggressive behavior at times but at other times she has been cooperative.   No past medical history on file. Past Surgical History  Procedure Laterality Date  . Cesarean section    . Induced abortion     Family History  Problem Relation Age of Onset  . Alcohol abuse Neg Hx    History  Substance Use Topics  . Smoking status: Former Smoker    Types: Cigarettes  . Smokeless tobacco: Not on file  . Alcohol Use: 1.2 oz/week    2 Cans of beer per week   OB History    Gravida Para Term Preterm AB TAB SAB Ectopic Multiple Living   5 1  1 3 3    1      Review of Systems  Unable to perform ROS   Allergies  Sulfa antibiotics  Home Medications   Prior to Admission medications   Medication Sig Start Date End Date Taking? Authorizing Provider  HYDROcodone-acetaminophen (NORCO/VICODIN) 5-325 MG per tablet Take 2 tablets by mouth every 4 (four) hours as needed for moderate pain or severe pain. Patient not taking: Reported on 12/10/2014 11/06/14   Marisa Severinlga Otter, MD  ibuprofen (ADVIL,MOTRIN) 800 MG tablet Take 1 tablet (800 mg total) by mouth every 8 (eight) hours as needed for mild pain, moderate pain or cramping. 11/06/14   Marisa Severinlga Otter, MD  misoprostol (CYTOTEC) 200 MCG tablet Take 1 tablet (200 mcg total) by mouth once. Patient not  taking: Reported on 12/10/2014 11/06/14   Tilda BurrowJohn Ferguson V, MD  Multiple Vitamin (MULTIVITAMIN WITH MINERALS) TABS tablet Take 1-3 tablets by mouth daily.    Historical Provider, MD  norethindrone (MICRONOR,CAMILA,ERRIN) 0.35 MG tablet Take 1 tablet (0.35 mg total) by mouth daily. Patient not taking: Reported on 12/10/2014 11/06/14   Tilda BurrowJohn Ferguson V, MD  ondansetron (ZOFRAN ODT) 8 MG disintegrating tablet Take 1 tablet (8 mg total) by mouth every 8 (eight) hours as needed for nausea or vomiting. Patient not taking: Reported on 12/10/2014 11/06/14   Marisa Severinlga Otter, MD   BP 119/68 mmHg  Pulse 108  Temp(Src) 98 F (36.7 C) (Oral)  Resp 18  Ht 5\' 3"  (1.6 m)  SpO2 100%   Physical Exam General: Well-developed, well-nourished female in no acute distress; appearance consistent with age of record HENT: normocephalic; atraumatic Eyes: pupils equal, round and reactive to light; extraocular muscles intact; dilated pupils Neck: supple Heart: regular rate and rhythm Lungs: clear to auscultation bilaterally Abdomen: soft; nondistended; bowel sounds present Extremities: No deformity; full range of motion; pulses normal Neurologic: Awake, alert; motor function intact in all extremities and symmetric; no facial droop Skin: Warm and dry Psychiatric: Flight of ideas; inappropriate responses to questions and stimuli (she fought off my stethoscope thinking it was a needle)    ED Course  Procedures (including critical care time)   MDM  Nursing notes and  vitals signs, including pulse oximetry, reviewed.  Summary of this visit's results, reviewed by myself:  Labs:  Results for orders placed or performed during the hospital encounter of 02/02/15 (from the past 24 hour(s))  Pregnancy, urine     Status: None   Collection Time: 02/02/15  3:13 AM  Result Value Ref Range   Preg Test, Ur NEGATIVE NEGATIVE  Drug screen panel, emergency     Status: Abnormal   Collection Time: 02/02/15  3:13 AM  Result Value Ref  Range   Opiates NONE DETECTED NONE DETECTED   Cocaine NONE DETECTED NONE DETECTED   Benzodiazepines NONE DETECTED NONE DETECTED   Amphetamines NONE DETECTED NONE DETECTED   Tetrahydrocannabinol POSITIVE (A) NONE DETECTED   Barbiturates NONE DETECTED NONE DETECTED  Ethanol     Status: Abnormal   Collection Time: 02/02/15  4:39 AM  Result Value Ref Range   Alcohol, Ethyl (B) 15 (H) 0 - 9 mg/dL  CBC with Differential/Platelet     Status: Abnormal   Collection Time: 02/02/15  4:39 AM  Result Value Ref Range   WBC 5.4 4.0 - 10.5 K/uL   RBC 4.03 3.87 - 5.11 MIL/uL   Hemoglobin 11.0 (L) 12.0 - 15.0 g/dL   HCT 47.8 (L) 29.5 - 62.1 %   MCV 85.1 78.0 - 100.0 fL   MCH 27.3 26.0 - 34.0 pg   MCHC 32.1 30.0 - 36.0 g/dL   RDW 30.8 65.7 - 84.6 %   Platelets 171 150 - 400 K/uL   Neutrophils Relative % 87 (H) 43 - 77 %   Neutro Abs 4.7 1.7 - 7.7 K/uL   Lymphocytes Relative 10 (L) 12 - 46 %   Lymphs Abs 0.5 (L) 0.7 - 4.0 K/uL   Monocytes Relative 3 3 - 12 %   Monocytes Absolute 0.1 0.1 - 1.0 K/uL   Eosinophils Relative 0 0 - 5 %   Eosinophils Absolute 0.0 0.0 - 0.7 K/uL   Basophils Relative 0 0 - 1 %   Basophils Absolute 0.0 0.0 - 0.1 K/uL  Basic metabolic panel     Status: Abnormal   Collection Time: 02/02/15  4:39 AM  Result Value Ref Range   Sodium 135 135 - 145 mmol/L   Potassium 3.3 (L) 3.5 - 5.1 mmol/L   Chloride 104 96 - 112 mmol/L   CO2 20 19 - 32 mmol/L   Glucose, Bld 109 (H) 70 - 99 mg/dL   BUN 10 6 - 23 mg/dL   Creatinine, Ser 9.62 0.50 - 1.10 mg/dL   Calcium 8.8 8.4 - 95.2 mg/dL   GFR calc non Af Amer >90 >90 mL/min   GFR calc Af Amer >90 >90 mL/min   Anion gap 11 5 - 15  Acetaminophen level     Status: Abnormal   Collection Time: 02/02/15  4:39 AM  Result Value Ref Range   Acetaminophen (Tylenol), Serum <10.0 (L) 10 - 30 ug/mL  Salicylate level     Status: None   Collection Time: 02/02/15  4:39 AM  Result Value Ref Range   Salicylate Lvl <4.0 2.8 - 20.0 mg/dL     8:41 AM Geodon  IM given for acute psychotic behavior.  7:39 AM Patient sleeping comfortably now. She will be discharged when her mental status normalizes.    Paula Libra, MD 02/02/15 [phone]

## 2015-02-02 NOTE — Discharge Instructions (Signed)
Hallucinogens °Hallucinogens are substances that cause extreme distortions of how you see images, hear sounds, and feel sensations that seem real but are not (hallucinations). Hallucinogen use also may cause very rapid and intense changes in mood, such as sudden fear or elation. Individual reactions to hallucinogens vary. It is impossible to predict the response to hallucinogen use. °Hallucinogens can occur naturally in substances that come from plants and mushrooms. Also, man-made hallucinogens have been discovered accidentally or developed for medical purposes. However, because of serious adverse effects, use of hallucinogens for medical purposes has been discontinued. °TYPES OF HALLUCINOGENS °The four most common types of hallucinogens are:  °· Lysergic acid diethylamide (LSD). This is a man-made drug. It comes in different forms, such as a liquid or a pill. The effects of LSD use may last about 12 hours. °· Peyote. This is a type of cactus. The plant contains a substance called mescaline, which produces the hallucinogenic effects. It can be chewed or soaked in water and drunk. The effects of mescaline last about 12 hours. °· Psilocybin. This substance comes from a type of mushroom. Pieces of the mushroom can be eaten fresh or dried. The effects last about 6 hours. °· Phencyclidine (PCP). This drug was originally developed as an anesthetic. It is available as a white powder. The powder may be in pill form, or it can be snorted or smoked. Effects of PCP use last about 4-6 hours. PCP is considered an addictive substance. Use of PCP can frequently cause extreme behaviors, resulting from intense, unreasonable fears (paranoia). °EFFECTS OF HALLUCINOGEN USE  °Hallucinogens interrupt the normal signals sent from your nerves to your brain. This makes the brain have a false sense of what is real (psychosis). Unlike most other drugs, the effects of hallucinogens are extremely variable and unreliable. Use of hallucinogens  produces different effects in different people at different times. There are short-term and long-term effects associated with hallucinogen use. The effects depend on the type of drug that is used. Short-term effects include: °· Hallucinations. °· Seizures. °· Muscle spasms. °· Confusion. °· Anxiety (including paranoia). °· Extreme sweating. °· Increased heart rate. °· Increased blood pressure. °· Enlarged pupils. °· Nausea and vomiting. °· Extreme mood changes. °· Distorted sense of time. °· Depression. °Long-term effects of hallucinogen use include: °· Permanent psychosis. °· Difficulty with speech and thinking. °· Weight loss. °· Depression. °· Memory loss. °· Flashbacks. These are recurrences of certain aspects of the drug experience. Flashbacks seem very real and feel just like the original experience. Flashbacks may happen for months after you stop using a hallucinogen. °· Violent behavior. °TREATMENT °Treatment can depend on whether the drug is man made (LSD and PCP) or occurs naturally (peyote and psilocybin mushrooms). Treatment for the use of naturally occurring hallucinogens is focused on reducing the uncomfortable symptoms and is usually supportive, such as providing a quiet room with little sensory stimulation. Occasionally, anxiety-reducing drugs are used to control extreme agitation or seizures. Treatment for the use of man-made hallucinogens may require additional methods, such as: °· Giving the person fluids through a tube inserted into one of their veins (intravenous [IV] tube). °· Giving oxygen to help the person breathe. °· Giving medicine to keep the heart beating at a controlled rate. °SEEK IMMEDIATE MEDICAL CARE IF: °· You feel like hurting yourself or someone else. °· You have any thoughts about taking your life. °Document Released: 06/24/2012 Document Revised: 10/05/2013 Document Reviewed: 06/24/2012 °ExitCare® Patient Information ©2015 [employer]. This information is not intended   to  replace advice given to you by your health care provider. Make sure you discuss any questions you have with your health care provider.

## 2015-02-02 NOTE — ED Notes (Signed)
[employer] was dispatched to apartment complex to find patient wandering and was told she went to several apartment complexes and was even found to be in a random apartment. She was restrained and placed in police car until EMS arrived. When EMS arrived pupils were found to be dilated to 6 or 7. She admitted to smoking marijuana and acid. Hallucinations and aggressive behavior at times but has times when she is oriented. VS: P: 100, unable to obtain other VS since pt was placed in handcuffs. Pt arrived to ED in handcuffs, [employer] at bedside.

## 2015-02-02 NOTE — ED Provider Notes (Signed)
As the patient is awake and alert.  States she feels much better that after taking LSD last night she started to feel like she was in a dangerous, and scary situation.  She denies SI or HI.  She is alert, oriented and appropriate.  She has called a ride who will pick her up to take her home.   1. Hallucinogen abuse with intoxication      Toy CookeyMegan Docherty, MD 02/02/15 [phone]

## 2015-02-02 NOTE — ED Notes (Signed)
Patient allowed IM injection of Geodon, after pushing medication patient grabbed this nurses hand.

## 2015-02-02 NOTE — ED Notes (Signed)
Patient intermittently screaming out loud, talking to self, statements to not make sense, nor do they connect one to another.

## 2015-02-02 NOTE — ED Notes (Signed)
Patient resting in bed at this time. Lights dimmed at patient request, and bed adjusted at stated comfort. No other needs voiced.

## 2015-12-07 IMAGING — US US OB TRANSVAGINAL
1 series · 14 of 28 positions shown · non-contrast
Comparison: None.

CLINICAL DATA: Vaginal bleeding and first-trimester pregnancy.

EXAM:
OBSTETRIC <14 WK US AND TRANSVAGINAL OB US
TECHNIQUE: Both transabdominal and transvaginal ultrasound examinations were
performed for complete evaluation of the gestation as well as the
maternal uterus, adnexal regions, and pelvic cul-de-sac.
Transvaginal technique was performed to assess early pregnancy.

[Series 1: us ob transvaginal · 0.18mm/px · 14 of 33 slices shown]
[im 2/33]
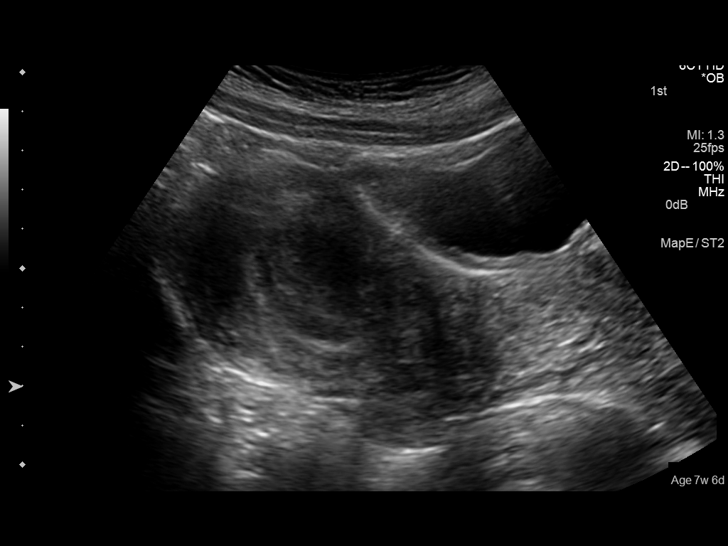
[im 4/33]
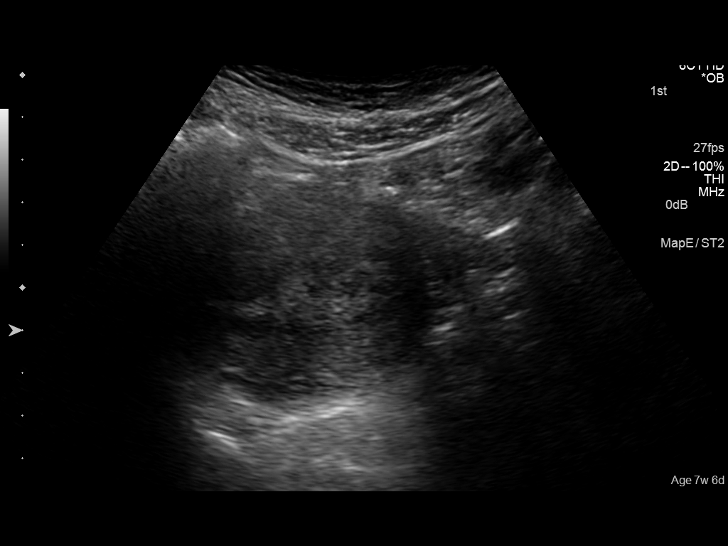
[im 6/33]
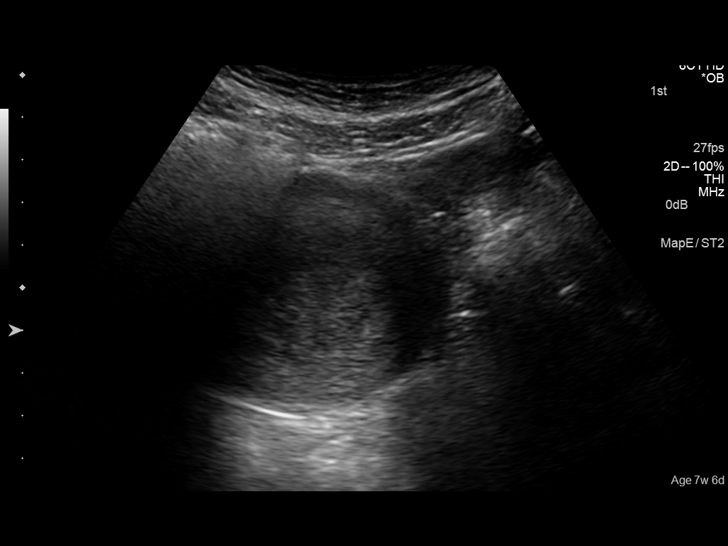
[im 9/33]
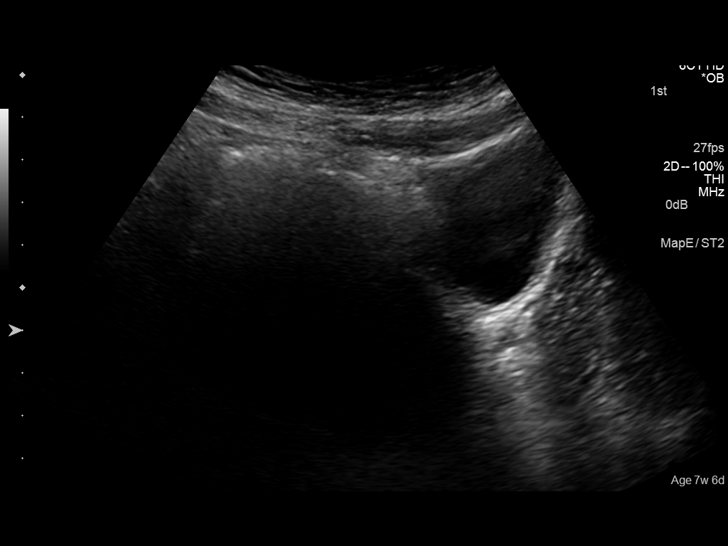
[im 11/33]
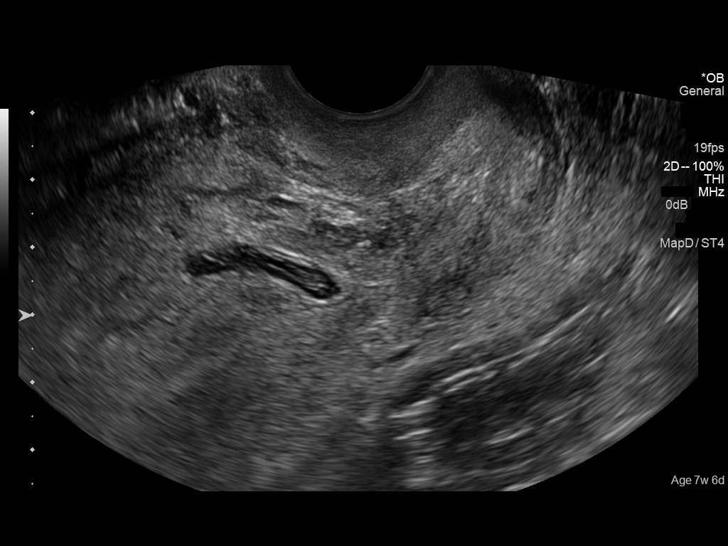
[im 14/33]
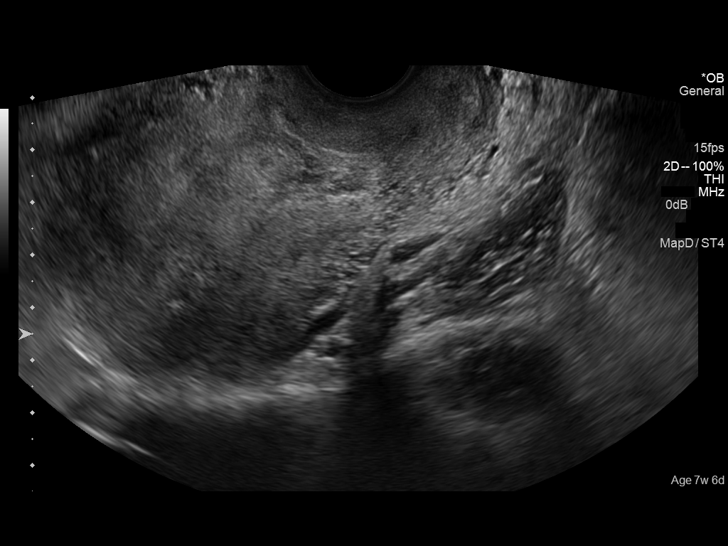
[im 16/33]
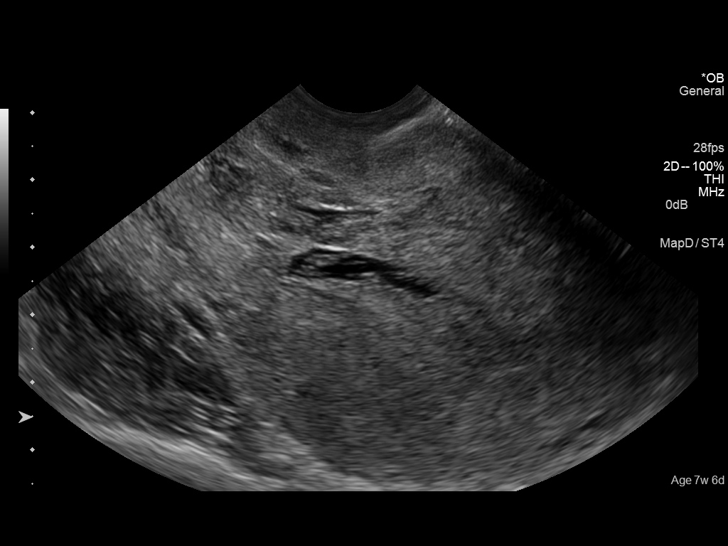
[im 18/33]
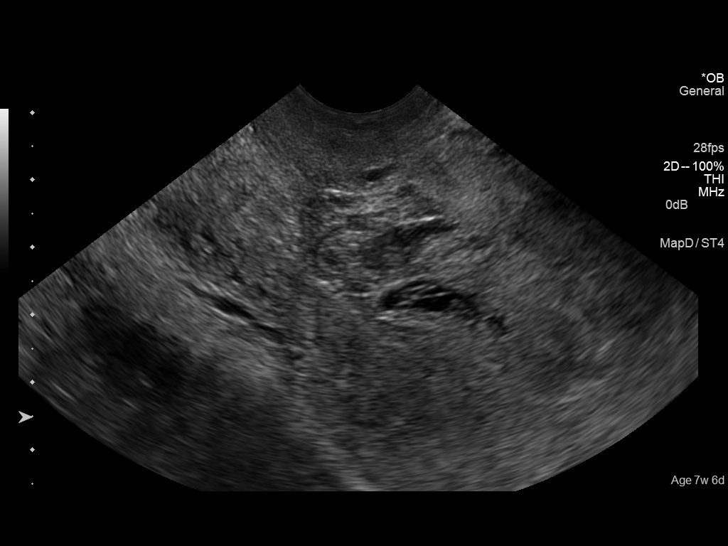
[im 21/33]
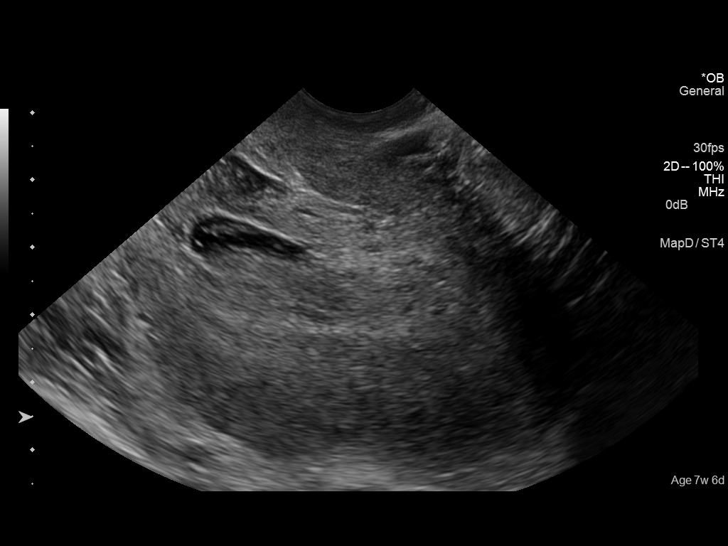
[im 23/33]
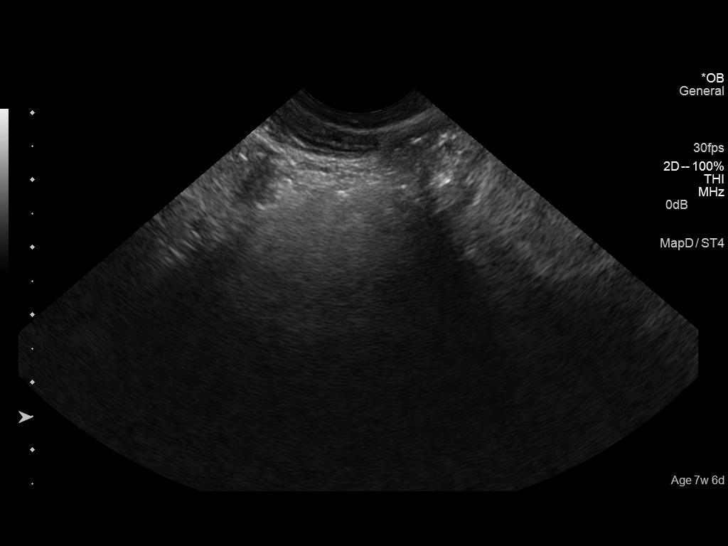
[im 25/33]
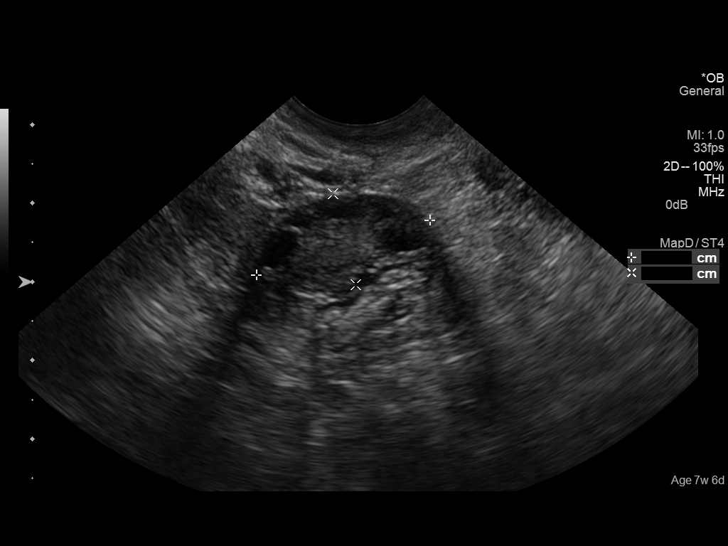
[im 28/33]
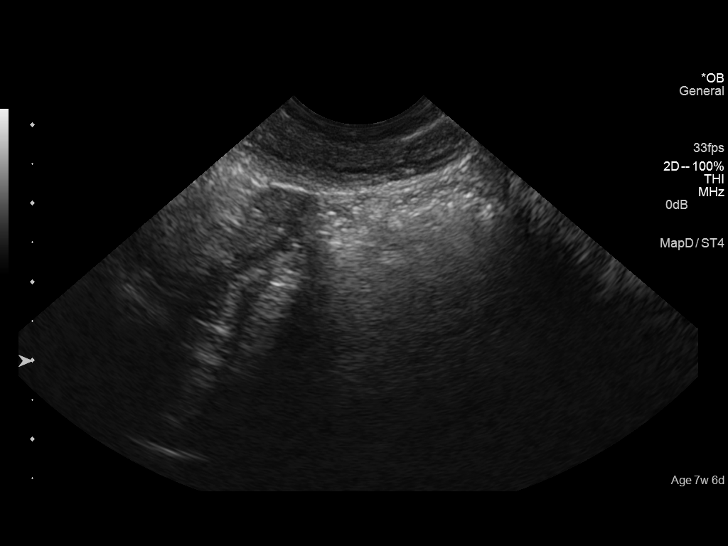
[im 30/33]
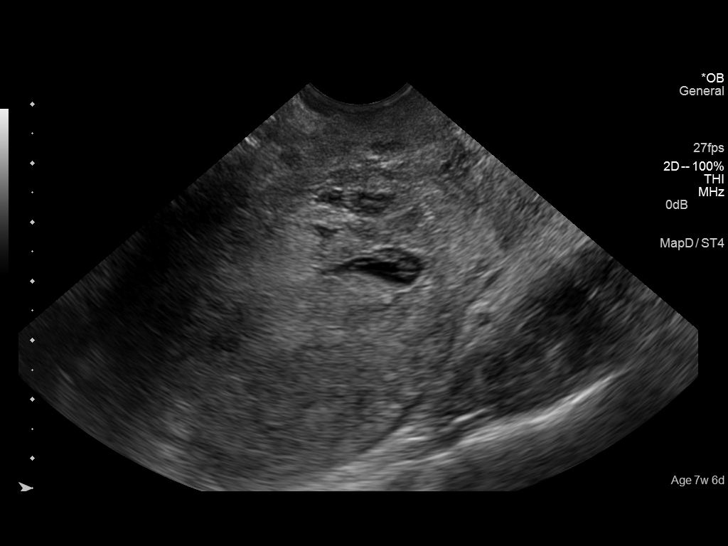
[im 33/33]
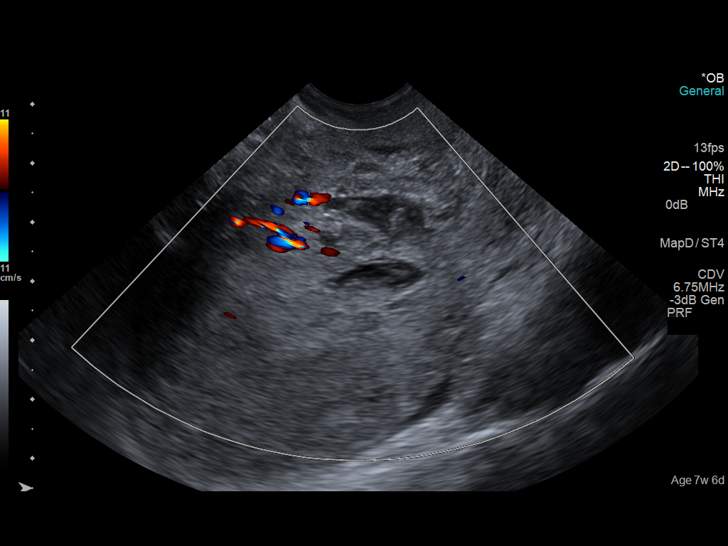

[14 of 28 positions shown; findings below may reference images not displayed]

FINDINGS: There is a flattened sac with internal membranes in the lower
uterine segment. Heterogeneous endometrium, both decidual reaction
and mobile blood. The left ovary is visualized and is normal. The
right ovary cannot be visualized due to pain. No adnexal mass or
definite pelvic fluid.
IMPRESSION: Probable intrauterine gestational sac which is low, flattened, and
associated with moderate sub chronic blood. Findings are most
consistent with abortion in progress. Serial beta HCG or followup
sonogram could be performed to confirm.

## 2021-03-14 ENCOUNTER — Emergency Department (HOSPITAL_COMMUNITY)
Admission: EM | Admit: 2021-03-14 | Discharge: 2021-03-15 | Disposition: A | Discharge status: Home / Self Care | Payer: Self-pay | Attending: Emergency Medicine | Admitting: Emergency Medicine

## 2021-03-14 ENCOUNTER — Encounter (HOSPITAL_COMMUNITY): Payer: Self-pay

## 2021-03-14 ENCOUNTER — Emergency Department (HOSPITAL_COMMUNITY): Payer: Self-pay

## 2021-03-14 ENCOUNTER — Ambulatory Visit (HOSPITAL_COMMUNITY)
Admission: EM | Admit: 2021-03-14 | Discharge: 2021-03-14 | Disposition: A | Discharge status: Home / Self Care | Payer: Self-pay

## 2021-03-14 ENCOUNTER — Encounter (HOSPITAL_COMMUNITY): Payer: Self-pay | Admitting: Emergency Medicine

## 2021-03-14 ENCOUNTER — Other Ambulatory Visit: Payer: Self-pay

## 2021-03-14 DIAGNOSIS — R1031 Right lower quadrant pain: Secondary | ICD-10-CM | POA: Insufficient documentation

## 2021-03-14 DIAGNOSIS — Z3A01 Less than 8 weeks gestation of pregnancy: Secondary | ICD-10-CM | POA: Insufficient documentation

## 2021-03-14 DIAGNOSIS — R102 Pelvic and perineal pain: Secondary | ICD-10-CM | POA: Insufficient documentation

## 2021-03-14 DIAGNOSIS — Z87891 Personal history of nicotine dependence: Secondary | ICD-10-CM | POA: Insufficient documentation

## 2021-03-14 DIAGNOSIS — O26891 Other specified pregnancy related conditions, first trimester: Secondary | ICD-10-CM | POA: Insufficient documentation

## 2021-03-14 LAB — COMPREHENSIVE METABOLIC PANEL
ALT: 16 U/L (ref 0–44)
AST: 20 U/L (ref 15–41)
Albumin: 3.6 g/dL (ref 3.5–5.0)
Alkaline Phosphatase: 38 U/L (ref 38–126)
Anion gap: 13 (ref 5–15)
BUN: 7 mg/dL (ref 6–20)
CO2: 21 mmol/L — ABNORMAL LOW (ref 22–32)
Calcium: 8.7 mg/dL — ABNORMAL LOW (ref 8.9–10.3)
Chloride: 100 mmol/L (ref 98–111)
Creatinine, Ser: 0.68 mg/dL (ref 0.44–1.00)
GFR, Estimated: >60 mL/min (ref >60)
Glucose, Bld: 90 mg/dL (ref 70–99)
Potassium: 3.5 mmol/L (ref 3.5–5.1)
Sodium: 134 mmol/L — ABNORMAL LOW (ref 135–145)
Total Bilirubin: 1 mg/dL (ref 0.3–1.2)
Total Protein: 6.8 g/dL (ref 6.5–8.1)

## 2021-03-14 LAB — CBC WITH DIFFERENTIAL/PLATELET
Abs Immature Granulocytes: 0.05 10*3/uL (ref 0.00–0.07)
Basophils Absolute: 0 10*3/uL (ref 0.0–0.1)
Basophils Relative: 1 %
Eosinophils Absolute: 0.1 10*3/uL (ref 0.0–0.5)
Eosinophils Relative: 2 %
HCT: 38.8 % (ref 36.0–46.0)
Hemoglobin: 13.1 g/dL (ref 12.0–15.0)
Immature Granulocytes: 1 %
Lymphocytes Relative: 19 %
Lymphs Abs: 1.7 10*3/uL (ref 0.7–4.0)
MCH: 32 pg (ref 26.0–34.0)
MCHC: 33.8 g/dL (ref 30.0–36.0)
MCV: 94.6 fL (ref 80.0–100.0)
Monocytes Absolute: 0.7 10*3/uL (ref 0.1–1.0)
Monocytes Relative: 8 %
Neutro Abs: 6.3 10*3/uL (ref 1.7–7.7)
Neutrophils Relative %: 69 %
Platelets: 222 10*3/uL (ref 150–400)
RBC: 4.1 MIL/uL (ref 3.87–5.11)
RDW: 12.4 % (ref 11.5–15.5)
WBC: 8.9 10*3/uL (ref 4.0–10.5)
nRBC: 0 % (ref 0.0–0.2)

## 2021-03-14 LAB — URINALYSIS, ROUTINE W REFLEX MICROSCOPIC
Bilirubin Urine: NEGATIVE
Glucose, UA: NEGATIVE mg/dL
Hgb urine dipstick: NEGATIVE
Ketones, ur: 20 mg/dL — AB
Leukocytes,Ua: NEGATIVE
Nitrite: POSITIVE — AB
Protein, ur: NEGATIVE mg/dL
Specific Gravity, Urine: 1.023 (ref 1.005–1.030)
pH: 5 (ref 5.0–8.0)

## 2021-03-14 LAB — LIPASE, BLOOD: Lipase: 28 U/L (ref 11–51)

## 2021-03-14 LAB — I-STAT BETA HCG BLOOD, ED (MC, WL, AP ONLY): I-stat hCG, quantitative: >2000 m[IU]/mL — ABNORMAL HIGH (ref <5)

## 2021-03-14 NOTE — ED Notes (Signed)
Patient is being discharged from the Urgent Care and sent to the Emergency Department via pov . Per Chales Salmon, NP, patient is in need of higher level of care due to RLQ pain. Patient is aware and verbalizes understanding of plan of care.  Vitals:   03/14/21 1818  BP: 124/87  Pulse: 71  Resp: 16  Temp: 99.5 F (37.5 C)  SpO2: 100%

## 2021-03-14 NOTE — Discharge Instructions (Addendum)
Go to the Emergency Department for further evaluation of your RLQ abdominal pain.

## 2021-03-14 NOTE — ED Triage Notes (Signed)
Acute on chronic RLQ pain , intermittent x 3 years. Pain now has been going on for a week   Reports of vaginal spotting, pt has irregular periods. Denies any diarrhea and vomiting.

## 2021-03-14 NOTE — ED Provider Notes (Signed)
[facility]    CSN: [id number] Arrival date & time: 03/14/21  1756      History   Chief Complaint Chief Complaint  Patient presents with  . Abdominal Pain    RLQ pain for 3 years - intermittent - more severe today    HPI Taylor Vang is a 30 y.o. female.   Patient here for evaluation of right lower quadrant pain that has been intermittent for the past several years.  Reports concern about possible ovarian cyst.  Reports has not been evaluated previously due to anxiety.  Reports having some nausea but denies any vomiting or diarrhea.  Denies any dysuria, urgency, or frequency.  Reports pain is stabbing and shooting.  Denies any trauma, injury, or other precipitating event.  Denies any specific alleviating or aggravating factors.  Denies any fevers, chest pain, shortness of breath, N/V/D, numbness, tingling, weakness, abdominal pain, or headaches.    The history is provided by the patient.  Abdominal Pain   History reviewed. No pertinent past medical history.  Patient Active Problem List   Diagnosis Date Noted  . Incomplete miscarriage 11/06/2014    Past Surgical History:  Procedure Laterality Date  . CESAREAN SECTION    . INDUCED ABORTION      OB History    Gravida  5   Para  1   Term      Preterm  1   AB  3   Living  1     SAB      IAB  3   Ectopic      Multiple      Live Births               Home Medications    Prior to Admission medications   Medication Sig Start Date End Date Taking? Authorizing Provider  HYDROcodone-acetaminophen (NORCO/VICODIN) 5-325 MG per tablet Take 2 tablets by mouth every 4 (four) hours as needed for moderate pain or severe pain. Patient not taking: Reported on 12/10/2014 11/06/14   Marisa Severin, MD  ibuprofen (ADVIL,MOTRIN) 800 MG tablet Take 1 tablet (800 mg total) by mouth every 8 (eight) hours as needed for mild pain, moderate pain or cramping. 11/06/14   Marisa Severin, MD  misoprostol (CYTOTEC) 200  MCG tablet Take 1 tablet (200 mcg total) by mouth once. Patient not taking: Reported on 12/10/2014 11/06/14   Tilda Burrow, MD  Multiple Vitamin (MULTIVITAMIN WITH MINERALS) TABS tablet Take 1-3 tablets by mouth daily.    [provider]  norethindrone (MICRONOR,CAMILA,ERRIN) 0.35 MG tablet Take 1 tablet (0.35 mg total) by mouth daily. Patient not taking: Reported on 12/10/2014 11/06/14   Tilda Burrow, MD  ondansetron (ZOFRAN ODT) 8 MG disintegrating tablet Take 1 tablet (8 mg total) by mouth every 8 (eight) hours as needed for nausea or vomiting. Patient not taking: Reported on 12/10/2014 11/06/14   Marisa Severin, MD    Family History Family History  Problem Relation Age of Onset  . Alcohol abuse Neg Hx     Social History Social History   Tobacco Use  . Smoking status: Former Smoker    Types: Cigarettes  Substance Use Topics  . Alcohol use: Yes    Alcohol/week: 2.0 standard drinks    Types: 2 Cans of beer per week  . Drug use: Yes    Types: Marijuana    Comment: acid     Allergies   Sulfa antibiotics   Review of Systems Review of Systems  Gastrointestinal: Positive for abdominal pain.  Genitourinary: Positive for pelvic pain.  All other systems reviewed and are negative.    Physical Exam Triage Vital Signs ED Triage Vitals  Enc Vitals Group     BP 03/14/21 1818 124/87     Pulse Rate 03/14/21 1818 71     Resp 03/14/21 1818 16     Temp 03/14/21 1818 99.5 F (37.5 C)     Temp Source 03/14/21 1818 Oral     SpO2 03/14/21 1818 100 %     Weight --      Height --      Head Circumference --      Peak Flow --      Pain Score 03/14/21 1820 8     Pain Loc --      Pain Edu? --      Excl. in GC? --    No data found.  Updated Vital Signs BP 124/87   Pulse 71   Temp 99.5 F (37.5 C) (Oral)   Resp 16   SpO2 100%   Visual Acuity Right Eye Distance:   Left Eye Distance:   Bilateral Distance:    Right Eye Near:   Left Eye Near:    Bilateral  Near:     Physical Exam Vitals and nursing note reviewed.  Constitutional:      General: She is not in acute distress.    Appearance: Normal appearance. She is not ill-appearing, toxic-appearing or diaphoretic.  HENT:     Head: Normocephalic and atraumatic.  Eyes:     Conjunctiva/sclera: Conjunctivae normal.  Cardiovascular:     Rate and Rhythm: Normal rate.     Pulses: Normal pulses.  Pulmonary:     Effort: Pulmonary effort is normal.  Abdominal:     General: Abdomen is flat.     Tenderness: There is abdominal tenderness in the right lower quadrant. There is guarding and rebound.  Musculoskeletal:        General: Normal range of motion.     Cervical back: Normal range of motion.  Skin:    General: Skin is warm and dry.  Neurological:     General: No focal deficit present.     Mental Status: She is alert and oriented to person, place, and time.  Psychiatric:        Mood and Affect: Mood normal.      UC Treatments / Results  Labs (all labs ordered are listed, but only abnormal results are displayed) Labs Reviewed - No data to display  EKG   Radiology No results found.  Procedures Procedures (including critical care time)  Medications Ordered in UC Medications - No data to display  Initial Impression / Assessment and Plan / UC Course  I have reviewed the triage vital signs and the nursing notes.  Pertinent labs & imaging results that were available during my care of the patient were reviewed by me and considered in my medical decision making (see chart for details).    Assessment limited due to significant RLQ pain.  Recommend going to the Emergency Department for further evaluation of possible ovarian cyst, appendicitis, cholecystis, or diverticulitis.  Patient will go to ED via private vehicle.   Final Clinical Impressions(s) / UC Diagnoses   Final diagnoses:  Right lower quadrant abdominal pain     Discharge Instructions     Go to the Emergency  Department for further evaluation of your RLQ abdominal pain.      ED Prescriptions  None     PDMP not reviewed this encounter.   Ivette Loyal, NP 03/14/21 1911

## 2021-03-14 NOTE — ED Triage Notes (Signed)
PT reports RLQ pain that has been intermittent for 3 years. More severe over the last week. States she sometimes feels a "popping" feeling and then has clear liquid exit her vagina. Also reports menstrual irregularities and spotting between cycles.

## 2021-03-14 NOTE — ED Provider Notes (Addendum)
Emergency Medicine Provider Triage Evaluation Note  Taylor Vang , a 30 y.o. female  was evaluated in triage.  Pt complains of RLQ pain.  Review of Systems  Positive: RLQ abd pain, nausea Negative: V/d, dysuria, vaginal bleeding, vaginal discharge   Physical Exam  Ht 5\' 3"  (1.6 m)   Wt 68 kg   BMI 26.57 kg/m  Gen:   Awake, no distress   Resp:  Normal effort  MSK:   Moves extremities without difficulty  Other:  TTP RLQ, R pelvic region  Medical Decision Making  Medically screening exam initiated at 9:07 PM.  Appropriate orders placed.  Taylor Vang was informed that the remainder of the evaluation will be completed by another provider, this initial triage assessment does not replace that evaluation, and the importance of remaining in the ED until their evaluation is complete.  Recurrent pain to R lower pelvic region x 3 years, worsen in the past few days.  Suspect ovarian cyst.  Has intact appendix.   9:57 PM preg test positive, I switched to OB US to r/o ectopic.      [place], PA-C 03/14/21 2108    2109, PA-C 03/14/21 2157    2158, MD 03/14/21 [id number]

## 2021-03-14 NOTE — ED Provider Notes (Signed)
[facility] EMERGENCY DEPARTMENT Provider Note   CSN: [id number] Arrival date & time: 03/14/21  1914     History Chief Complaint  Patient presents with  . lower abdoiminal pain    Taylor Vang is a 30 y.o. female.  Patient is a 30 year old female with no significant past medical history.  She presents today for evaluation of right lower quadrant pain.  Patient states that this has been present for approximately 3 years.  The pain comes and goes, but has been much worse over the past several days.  She denies any fevers or chills.  She denies any bowel or bladder complaints.  She was seen at urgent care, then referred here for further work-up.  Her pain is worse with palpation and movement.  There are no alleviating factors.  She denies vaginal discharge and states her last menstrual period was mid April, but it is not uncommon for her periods to be irregular.  The history is provided by the patient.       History reviewed. No pertinent past medical history.  Patient Active Problem List   Diagnosis Date Noted  . Incomplete miscarriage 11/06/2014    Past Surgical History:  Procedure Laterality Date  . CESAREAN SECTION    . INDUCED ABORTION       OB History    Gravida  5   Para  1   Term      Preterm  1   AB  3   Living  1     SAB      IAB  3   Ectopic      Multiple      Live Births              Family History  Problem Relation Age of Onset  . Alcohol abuse Neg Hx     Social History   Tobacco Use  . Smoking status: Former Smoker    Types: Cigarettes  Substance Use Topics  . Alcohol use: Yes    Alcohol/week: 2.0 standard drinks    Types: 2 Cans of beer per week  . Drug use: Yes    Types: Marijuana    Comment: acid    Home Medications Prior to Admission medications   Medication Sig Start Date End Date Taking? Authorizing Provider  HYDROcodone-acetaminophen (NORCO/VICODIN) 5-325 MG per tablet Take 2 tablets by  mouth every 4 (four) hours as needed for moderate pain or severe pain. Patient not taking: Reported on 12/10/2014 11/06/14   Marisa Severin, MD  ibuprofen (ADVIL,MOTRIN) 800 MG tablet Take 1 tablet (800 mg total) by mouth every 8 (eight) hours as needed for mild pain, moderate pain or cramping. 11/06/14   Marisa Severin, MD  misoprostol (CYTOTEC) 200 MCG tablet Take 1 tablet (200 mcg total) by mouth once. Patient not taking: Reported on 12/10/2014 11/06/14   Tilda Burrow, MD  Multiple Vitamin (MULTIVITAMIN WITH MINERALS) TABS tablet Take 1-3 tablets by mouth daily.    [provider]  norethindrone (MICRONOR,CAMILA,ERRIN) 0.35 MG tablet Take 1 tablet (0.35 mg total) by mouth daily. Patient not taking: Reported on 12/10/2014 11/06/14   Tilda Burrow, MD  ondansetron (ZOFRAN ODT) 8 MG disintegrating tablet Take 1 tablet (8 mg total) by mouth every 8 (eight) hours as needed for nausea or vomiting. Patient not taking: Reported on 12/10/2014 11/06/14   Marisa Severin, MD    Allergies    Sulfa antibiotics  Review of Systems   Review of  Systems  All other systems reviewed and are negative.   Physical Exam Updated Vital Signs Ht 5\' 3"  (1.6 m)   Wt 68 kg   BMI 26.57 kg/m   Physical Exam Vitals and nursing note reviewed.  Constitutional:      General: She is not in acute distress.    Appearance: She is well-developed. She is not diaphoretic.  HENT:     Head: Normocephalic and atraumatic.  Cardiovascular:     Rate and Rhythm: Normal rate and regular rhythm.     Heart sounds: No murmur heard. No friction rub. No gallop.   Pulmonary:     Effort: Pulmonary effort is normal. No respiratory distress.     Breath sounds: Normal breath sounds. No wheezing.  Abdominal:     General: Bowel sounds are normal. There is no distension.     Palpations: Abdomen is soft.     Tenderness: There is abdominal tenderness. There is no right CVA tenderness, left CVA tenderness, guarding or rebound.      Comments: There is mild right lower quadrant tenderness, but no rebound or guarding.  Musculoskeletal:        General: Normal range of motion.     Cervical back: Normal range of motion and neck supple.  Skin:    General: Skin is warm and dry.  Neurological:     Mental Status: She is alert and oriented to person, place, and time.     ED Results / Procedures / Treatments   Labs (all labs ordered are listed, but only abnormal results are displayed) Labs Reviewed  COMPREHENSIVE METABOLIC PANEL - Abnormal; Notable for the following components:      Result Value   Sodium 134 (*)    CO2 21 (*)    Calcium 8.7 (*)    All other components within normal limits  URINALYSIS, ROUTINE W REFLEX MICROSCOPIC - Abnormal; Notable for the following components:   Color, Urine AMBER (*)    APPearance HAZY (*)    Ketones, ur 20 (*)    Nitrite POSITIVE (*)    Bacteria, UA RARE (*)    All other components within normal limits  I-STAT BETA HCG BLOOD, ED ([facility], [facility], [facility] ONLY) - Abnormal; Notable for the following components:   I-stat hCG, quantitative >2,000.0 (*)    All other components within normal limits  URINE CULTURE  CBC WITH DIFFERENTIAL/PLATELET  LIPASE, BLOOD  I-STAT BETA HCG BLOOD, ED ([facility], [facility], [facility] ONLY)    EKG None  Radiology OB LESS THAN 14 WEEKS W/ OB TRANSVAGINAL AND DOPPLER  Result Date: 03/14/2021 CLINICAL DATA:  Pelvic pain, pregnant EXAM: OBSTETRIC <14 WK 05/14/2021 AND TRANSVAGINAL OB US DOPPLER ULTRASOUND OF OVARIES TECHNIQUE: Both transabdominal and transvaginal ultrasound examinations were performed for complete evaluation of the gestation as well as the maternal uterus, adnexal regions, and pelvic cul-de-sac. Transvaginal technique was performed to assess early pregnancy. Color and duplex Doppler ultrasound was utilized to evaluate blood flow to the ovaries. COMPARISON:  None. FINDINGS: Intrauterine gestational sac: Single Yolk sac:  Visualized. Embryo:  Visualized. Cardiac Activity:  Visualized. Heart Rate: 153 bpm CRL: 13.6 mm   7 w 4 d                  [place] EDC: 11/02/2021 Subchorionic hemorrhage:  None visualized. Maternal uterus/adnexae: Uterus is anteverted measuring 8.6 x 5.5 x 8.1 cm. Right ovary measures 3.2 x 2.6 x 2.8 cm with a 2.5 cm corpus luteum cyst. Left ovary  measures 1.7 x 1.3 x 1.6 cm. No free fluid or adnexal mass. Pulsed Doppler evaluation of both ovaries demonstrates normal appearing low-resistance arterial and venous waveforms. IMPRESSION: 1. Single live intrauterine pregnancy as above estimated age 71 weeks and 4 days. 2. No evidence of ovarian torsion. Electronically Signed   By: Sharlet Salina M.D.   On: 03/14/2021 22:16    Procedures Procedures   Medications Ordered in ED Medications - No data to display  ED Course  I have reviewed the triage vital signs and the nursing notes.  Pertinent labs & imaging results that were available during my care of the patient were reviewed by me and considered in my medical decision making (see chart for details).    MDM Rules/Calculators/A&P  Patient presenting with right lower quadrant pain that has been present for several years.  It has become worse recently.  She was sent for urgent care for further work-up of this.  She arrives here with stable vital signs, is afebrile, and has no white count.  Pregnancy test was positive, greater than 2000 by i-STAT.  Pelvic ultrasound was obtained showing intrauterine pregnancy consistent with a 7-week 4-day gestation.  Patient was informed of these results.  At this point, nothing appears emergent.  There is no evidence for torsion, I highly doubt appendicitis, and physical exam, presentation, and laboratory studies not consistent with cholecystitis.  She does have nitrite positive urine, but no other abnormal findings.  Culture will be added and treated if positive.  I suspect the worsening of her pain is related to hormone/possibly round ligament pain.  Patient to be  discharged with rest, Tylenol, follow-up with OB, and return as needed if symptoms worsen or change.  Final Clinical Impression(s) / ED Diagnoses Final diagnoses:  Pelvic pain    Rx / DC Orders ED Discharge Orders    None       Geoffery Lyons, MD 03/14/21 2359

## 2021-03-15 NOTE — Discharge Instructions (Signed)
Follow-up with women's health care to arrange OB care.  Take Tylenol 1000 mg every 6 hours as needed for pain.  This medication is safe for pregnancy.  Follow-up with OB in the next 1 to 2 weeks.  The [facility] for the [facility] has been provided in this discharge summary for you to call and make these arrangements.  Return to the emergency department in the meantime if you develop severe bleeding, worsening abdominal pain, high fevers, or other new and concerning symptoms.

## 2021-03-18 LAB — URINE CULTURE: Culture: >=100000 — AB

## 2021-03-19 ENCOUNTER — Telehealth: Payer: Self-pay | Admitting: *Deleted

## 2021-03-19 NOTE — Telephone Encounter (Signed)
Post ED Visit - Positive Culture Follow-up: Unsuccessful Patient Follow-up  Culture assessed and recommendations reviewed by:  []  , Pharm.D. []  Enzo Bi, Pharm.D., BCPS AQ-ID []  , Pharm.D., BCPS []  Celedonio Miyamoto, Pharm.D., BCPS []  [place], Garvin Fila.D., BCPS, AAHIVP []  , Pharm.D., BCPS, AAHIVP []  Georgina Pillion, PharmD []  , PharmD, BCPS  Positive urine culture  [x]  Patient discharged without antimicrobial prescription and treatment is now indicated []  Organism is resistant to prescribed ED discharge antimicrobial []  Patient with positive blood cultures  Plan:  Keflex 500mg  PO BID x 7 days,  Dr. [place]  Unable to contact patient after 3 attempts, letter will be sent to address on file  [address] 03/19/2021, 10:10 AM

## 2021-03-19 NOTE — Progress Notes (Signed)
ED Antimicrobial Stewardship Positive Culture Follow Up   Taylor Vang is an 30 y.o. female who presented to [facility] on 03/14/2021 with a chief complaint of  Chief Complaint  Patient presents with  . lower abdoiminal pain    Recent Results (from the past 720 hour(s))  Urine culture     Status: Abnormal   Collection Time: 03/14/21  9:21 PM   Specimen: Urine, Random  Result Value Ref Range Status   Specimen Description URINE, RANDOM  Final   Special Requests   Final    NONE Performed at [facility], [address] [place], [place] [address]    Culture >=100,000 COLONIES/mL ESCHERICHIA COLI (A)  Final   Report Status 03/18/2021 FINAL  Final   Organism ID, Bacteria ESCHERICHIA COLI (A)  Final      Susceptibility   Escherichia coli - MIC*    AMPICILLIN >=32 RESISTANT Resistant     CEFAZOLIN 8 SENSITIVE Sensitive     CEFEPIME <=0.12 SENSITIVE Sensitive     CEFTRIAXONE 0.5 SENSITIVE Sensitive     CIPROFLOXACIN <=0.25 SENSITIVE Sensitive     GENTAMICIN <=1 SENSITIVE Sensitive     IMIPENEM <=0.25 SENSITIVE Sensitive     NITROFURANTOIN <=16 SENSITIVE Sensitive     TRIMETH/SULFA <=20 SENSITIVE Sensitive     AMPICILLIN/SULBACTAM >=32 RESISTANT Resistant     PIP/TAZO <=4 SENSITIVE Sensitive     * >=100,000 COLONIES/mL ESCHERICHIA COLI    [x]  Patient discharged originally without antimicrobial agent and treatment is now indicated  New antibiotic prescription: Keflex 500mg  BID x 7 days  ED Provider: 03/19/2021, 10:34 AM Clinical Pharmacist ED Pharmacist -  [phone]

## 2022-04-14 IMAGING — US US OB < 14 WKS - US OB TV - US DOPPLER
1 series · 13 of 28 positions shown · non-contrast
Comparison: None.

CLINICAL DATA: Pelvic pain, pregnant



[Series 1: us pelvis (transabdominal only) · 13 of 62 slices shown]
[im 3/62]
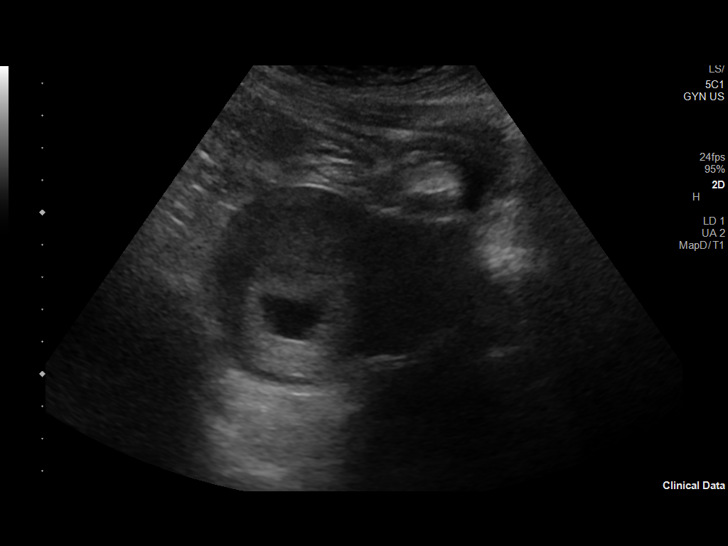
[im 7/62]
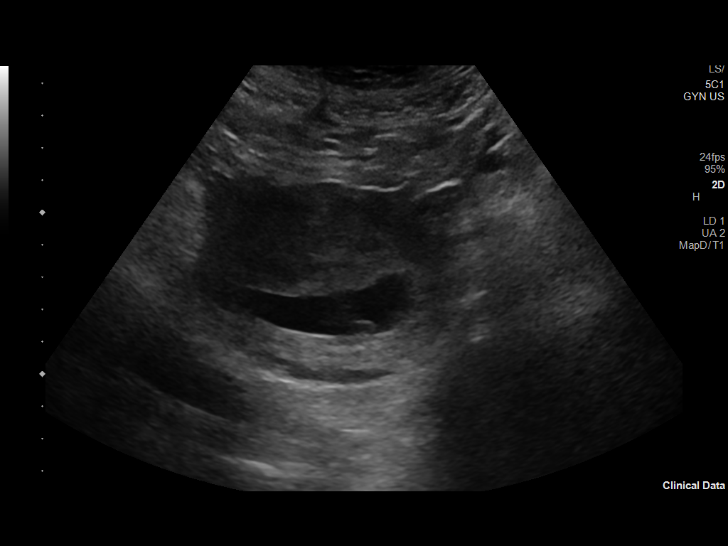
[im 12/62]
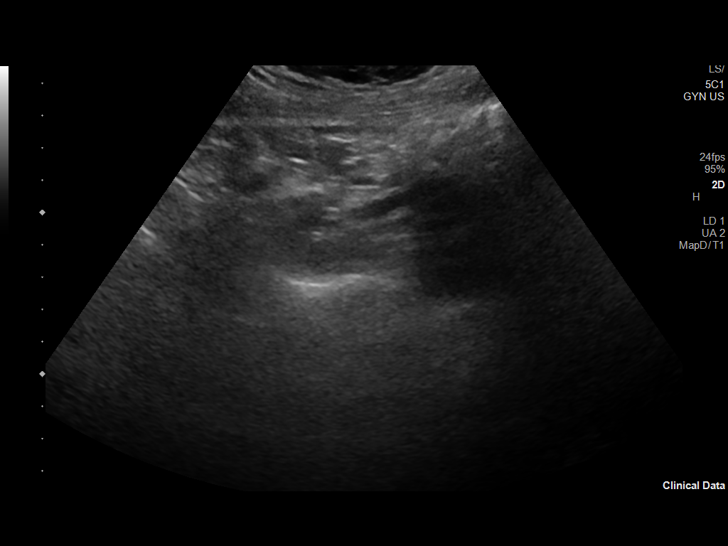
[im 16/62]
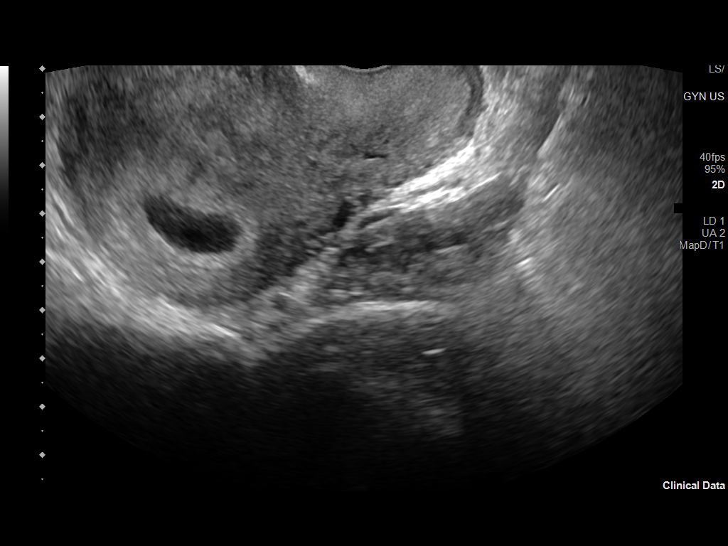
[im 21/62]
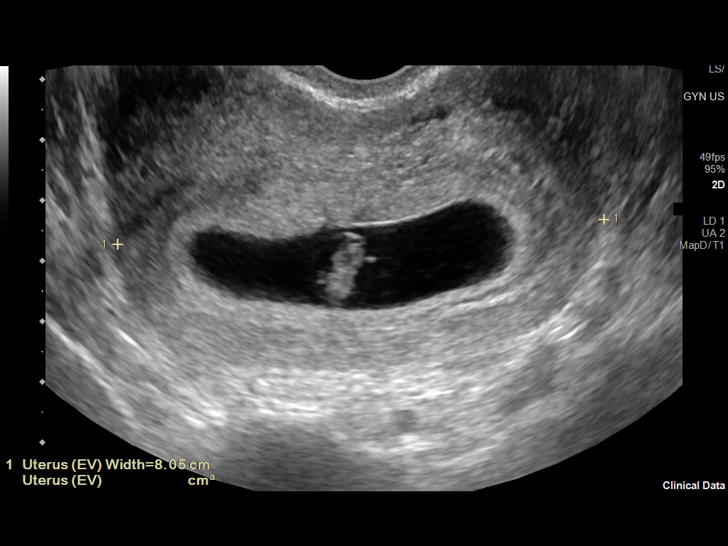
[im 25/62]
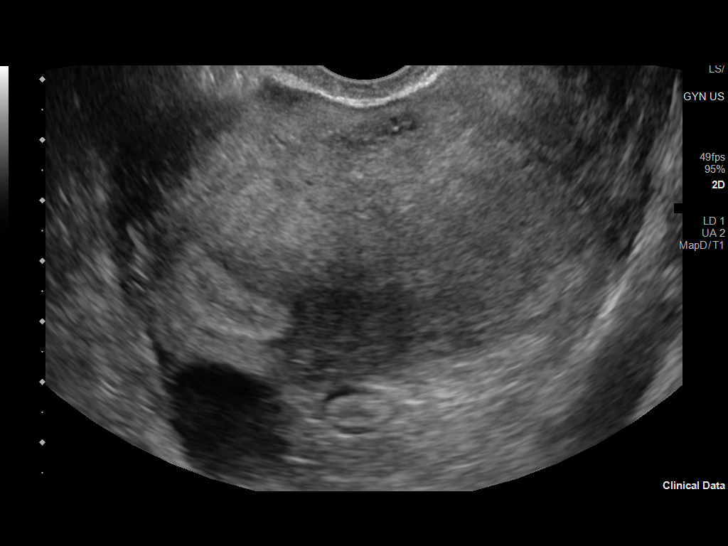
[im 32/62]
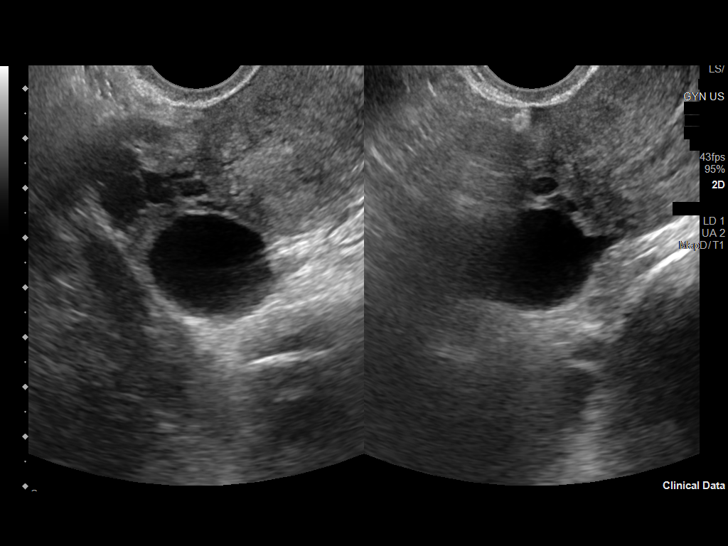
[im 37/62]
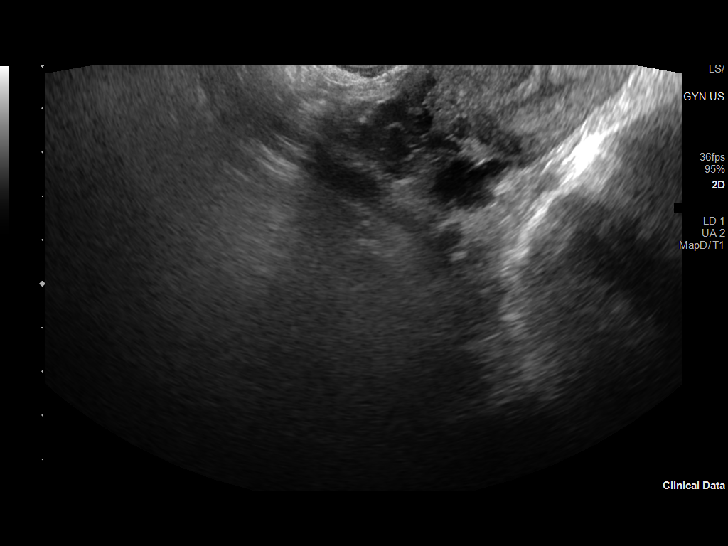
[im 41/62]
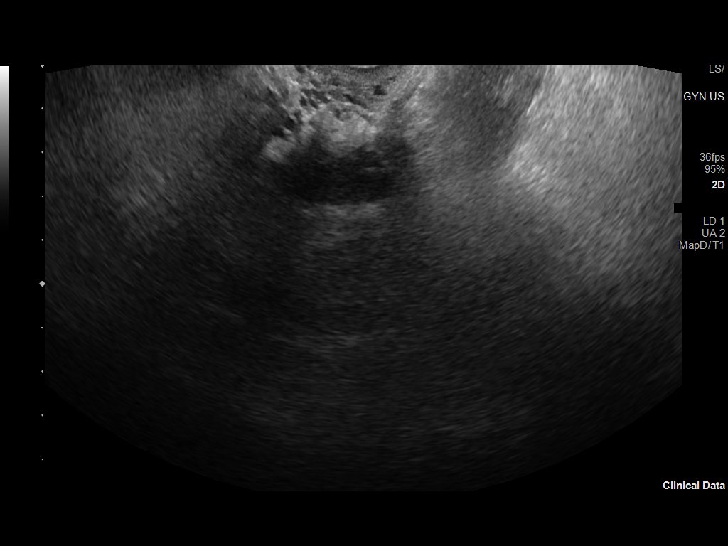
[im 46/62]
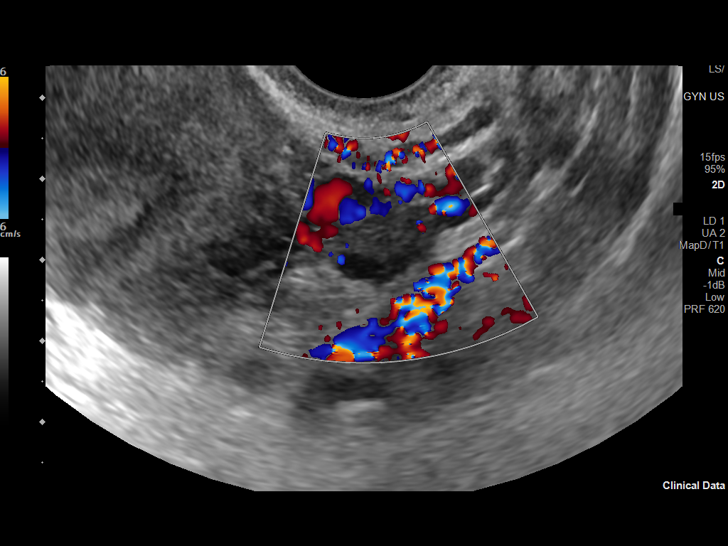
[im 50/62]
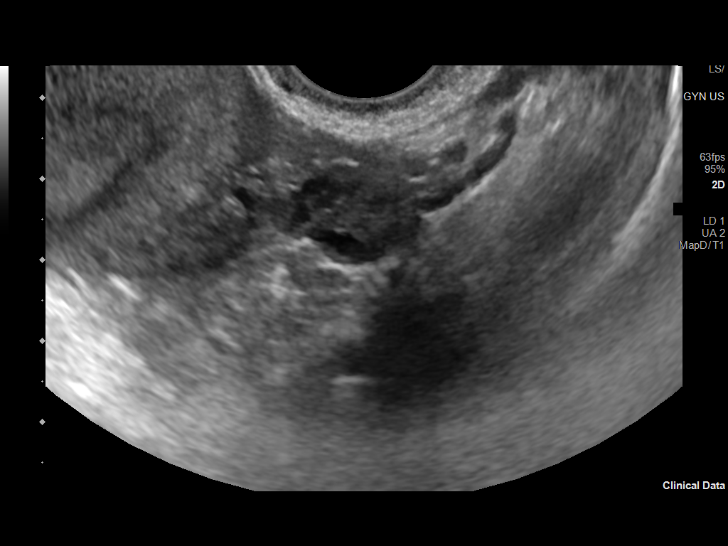
[im 55/62]
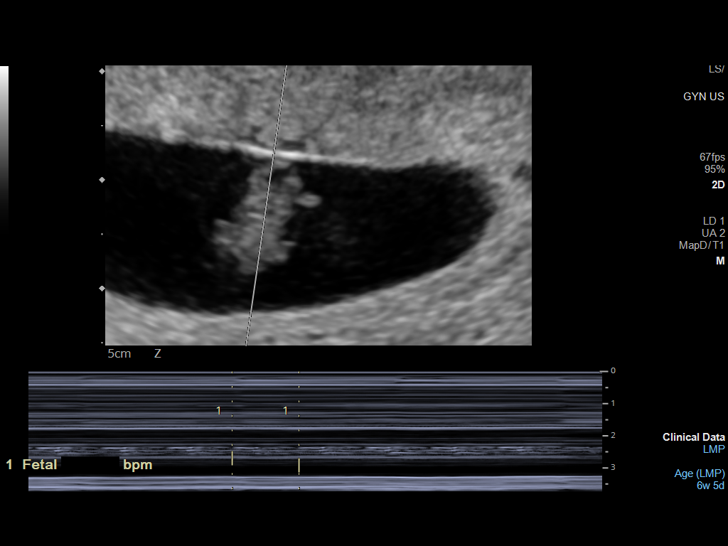
[im 59/62]
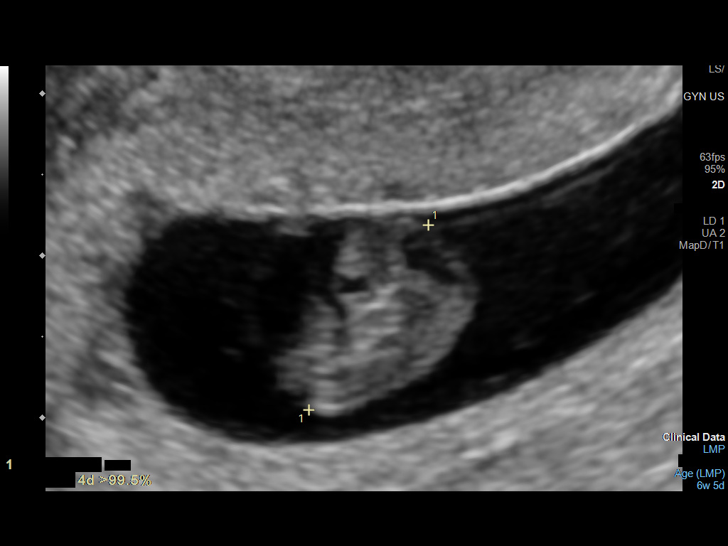

[13 of 28 positions shown; findings below may reference images not displayed]

FINDINGS: Intrauterine gestational sac: Single

Yolk sac:  Visualized.

Embryo:  Visualized.

Cardiac Activity: Visualized.

Heart Rate: 153 bpm

CRL: 13.6 mm   7 w 4 d                  US EDC: 11/02/2021

Subchorionic hemorrhage:  None visualized.

Maternal uterus/adnexae: Uterus is anteverted measuring 8.6 x 5.5 x
8.1 cm. Right ovary measures 3.2 x 2.6 x 2.8 cm with a 2.5 cm corpus
luteum cyst. Left ovary measures 1.7 x 1.3 x 1.6 cm. No free fluid
or adnexal mass.

Pulsed Doppler evaluation of both ovaries demonstrates normal
appearing low-resistance arterial and venous waveforms.
IMPRESSION: 1. Single live intrauterine pregnancy as above estimated age 7 weeks
and 4 days.
2. No evidence of ovarian torsion.

## 2024-03-23 ENCOUNTER — Ambulatory Visit: Admitting: Family Medicine

## 2024-03-26 ENCOUNTER — Telehealth: Payer: Self-pay

## 2024-03-26 NOTE — Telephone Encounter (Signed)
 Copied from CRM [id number]. Topic: Appointments - Scheduling Inquiry for Clinic >> Mar 26, 2024  8:53 AM Taylor Vang wrote: Reason for CRM: Pt had a NP appt on 03/23/24 and daughter had an NP appt for 03/24/24 but mother of Spencer passed away and they were unable to make the appt. They would still like to see DR. Bambi Lever as they researched her and were anticipating this appt for months. Wanted to know if it would still be possible to schedule with her (showing she is not taking new patients online and I was unable to book with her).  Please call patient back at earliest convenience at [phone] with any update on this.  **Pt's mom passed away so she missed the appt.  Would still like to see Dr. Bambi Lever.  Can she make an exception in this situation?

## 2024-03-26 NOTE — Telephone Encounter (Addendum)
 LVM with pt- Please reschedule New Pt appt with Dr. Ozell-- ok'd by Dr. Ozell on 04/15/24 due to pt having a death in the family.

## 2024-03-26 NOTE — Telephone Encounter (Signed)
 Yes ok to make exception

## 2024-03-29 NOTE — Telephone Encounter (Signed)
 LVM for pt to call back and reschedule- Dr. Bambi Lever has ok'd to schedule a New Pt appt.

## 2024-04-06 NOTE — Telephone Encounter (Signed)
 LVM for pt to call back--Dr. Ozell has agreed to let pt reschedule for a new patient appt.
